# Patient Record
Sex: Female | Born: 1945 | Race: White | Hispanic: No | Marital: Married | Smoking: Former smoker
Health system: Southern US, Community
[De-identification: ages and names within clinical notes are randomized; demographics above are authoritative.]

## PROBLEM LIST (undated history)

## (undated) DIAGNOSIS — J449 Chronic obstructive pulmonary disease, unspecified: Secondary | ICD-10-CM

## (undated) DIAGNOSIS — E079 Disorder of thyroid, unspecified: Secondary | ICD-10-CM

## (undated) DIAGNOSIS — I10 Essential (primary) hypertension: Secondary | ICD-10-CM

## (undated) DIAGNOSIS — F99 Mental disorder, not otherwise specified: Secondary | ICD-10-CM

## (undated) DIAGNOSIS — I639 Cerebral infarction, unspecified: Secondary | ICD-10-CM

## (undated) DIAGNOSIS — D649 Anemia, unspecified: Secondary | ICD-10-CM

## (undated) DIAGNOSIS — F329 Major depressive disorder, single episode, unspecified: Secondary | ICD-10-CM

## (undated) DIAGNOSIS — G2581 Restless legs syndrome: Secondary | ICD-10-CM

## (undated) DIAGNOSIS — E785 Hyperlipidemia, unspecified: Secondary | ICD-10-CM

## (undated) DIAGNOSIS — R112 Nausea with vomiting, unspecified: Secondary | ICD-10-CM

## (undated) DIAGNOSIS — R0981 Nasal congestion: Secondary | ICD-10-CM

## (undated) DIAGNOSIS — K219 Gastro-esophageal reflux disease without esophagitis: Secondary | ICD-10-CM

## (undated) DIAGNOSIS — F32A Depression, unspecified: Secondary | ICD-10-CM

## (undated) DIAGNOSIS — F419 Anxiety disorder, unspecified: Secondary | ICD-10-CM

## (undated) DIAGNOSIS — M0230 Reiter's disease, unspecified site: Secondary | ICD-10-CM

## (undated) DIAGNOSIS — G709 Myoneural disorder, unspecified: Secondary | ICD-10-CM

## (undated) DIAGNOSIS — Z87440 Personal history of urinary (tract) infections: Secondary | ICD-10-CM

## (undated) DIAGNOSIS — K5792 Diverticulitis of intestine, part unspecified, without perforation or abscess without bleeding: Secondary | ICD-10-CM

## (undated) DIAGNOSIS — Z9889 Other specified postprocedural states: Secondary | ICD-10-CM

## (undated) DIAGNOSIS — T4145XA Adverse effect of unspecified anesthetic, initial encounter: Secondary | ICD-10-CM

## (undated) DIAGNOSIS — C801 Malignant (primary) neoplasm, unspecified: Secondary | ICD-10-CM

## (undated) HISTORY — PX: EYE SURGERY: SHX253

## (undated) HISTORY — DX: Reiter's disease, unspecified site: M02.30

## (undated) HISTORY — DX: Myoneural disorder, unspecified: G70.9

## (undated) HISTORY — DX: Diverticulitis of intestine, part unspecified, without perforation or abscess without bleeding: K57.92

## (undated) HISTORY — DX: Hyperlipidemia, unspecified: E78.5

## (undated) HISTORY — DX: Essential (primary) hypertension: I10

## (undated) HISTORY — PX: BREAST RECONSTRUCTION: SHX9

## (undated) HISTORY — DX: Restless legs syndrome: G25.81

## (undated) HISTORY — PX: TRAM: SHX5363

## (undated) HISTORY — DX: Cerebral infarction, unspecified: I63.9

## (undated) HISTORY — DX: Nasal congestion: R09.81

## (undated) HISTORY — DX: Anemia, unspecified: D64.9

## (undated) HISTORY — DX: Personal history of urinary (tract) infections: Z87.440

## (undated) HISTORY — DX: Malignant (primary) neoplasm, unspecified: C80.1

## (undated) HISTORY — PX: BACK SURGERY: SHX140

## (undated) HISTORY — DX: Gastro-esophageal reflux disease without esophagitis: K21.9

## (undated) HISTORY — DX: Disorder of thyroid, unspecified: E07.9

---

## 1974-10-19 HISTORY — PX: ABDOMINAL HYSTERECTOMY: SHX81

## 1975-10-20 DIAGNOSIS — T8859XA Other complications of anesthesia, initial encounter: Secondary | ICD-10-CM

## 1975-10-20 HISTORY — DX: Other complications of anesthesia, initial encounter: T88.59XA

## 2004-09-04 ENCOUNTER — Ambulatory Visit: Payer: Self-pay | Admitting: Internal Medicine

## 2004-10-19 HISTORY — PX: OVARY SURGERY: SHX727

## 2004-10-24 ENCOUNTER — Ambulatory Visit: Payer: Self-pay | Admitting: Gastroenterology

## 2004-11-11 ENCOUNTER — Ambulatory Visit: Payer: Self-pay | Admitting: Gastroenterology

## 2004-11-19 ENCOUNTER — Ambulatory Visit: Payer: Self-pay | Admitting: Obstetrics and Gynecology

## 2005-03-19 ENCOUNTER — Ambulatory Visit: Payer: Self-pay | Admitting: Obstetrics and Gynecology

## 2005-11-20 ENCOUNTER — Ambulatory Visit: Payer: Self-pay | Admitting: Gastroenterology

## 2006-04-20 ENCOUNTER — Ambulatory Visit: Payer: Self-pay | Admitting: Obstetrics and Gynecology

## 2006-09-03 ENCOUNTER — Ambulatory Visit: Payer: Self-pay | Admitting: Internal Medicine

## 2006-09-13 ENCOUNTER — Ambulatory Visit: Payer: Self-pay | Admitting: Internal Medicine

## 2006-09-28 ENCOUNTER — Ambulatory Visit: Payer: Self-pay | Admitting: Surgery

## 2006-10-19 HISTORY — PX: CHOLECYSTECTOMY: SHX55

## 2006-11-24 ENCOUNTER — Ambulatory Visit: Payer: Self-pay | Admitting: Gastroenterology

## 2007-04-27 ENCOUNTER — Ambulatory Visit: Payer: Self-pay | Admitting: Obstetrics and Gynecology

## 2007-10-20 DIAGNOSIS — C801 Malignant (primary) neoplasm, unspecified: Secondary | ICD-10-CM

## 2007-10-20 HISTORY — DX: Malignant (primary) neoplasm, unspecified: C80.1

## 2007-10-20 HISTORY — PX: BREAST SURGERY: SHX581

## 2008-06-06 ENCOUNTER — Ambulatory Visit: Payer: Self-pay | Admitting: Obstetrics and Gynecology

## 2008-06-29 ENCOUNTER — Ambulatory Visit: Payer: Self-pay | Admitting: Surgery

## 2008-07-12 ENCOUNTER — Encounter: Admission: RE | Admit: 2008-07-12 | Discharge: 2008-07-12 | Payer: Self-pay | Admitting: General Surgery

## 2008-07-19 ENCOUNTER — Ambulatory Visit: Payer: Self-pay | Admitting: Gastroenterology

## 2008-07-27 ENCOUNTER — Encounter (INDEPENDENT_AMBULATORY_CARE_PROVIDER_SITE_OTHER): Payer: Self-pay | Admitting: General Surgery

## 2008-07-29 ENCOUNTER — Inpatient Hospital Stay (HOSPITAL_COMMUNITY): Admission: RE | Admit: 2008-07-29 | Discharge: 2008-07-30 | Payer: Self-pay | Admitting: General Surgery

## 2008-08-03 ENCOUNTER — Ambulatory Visit: Payer: Self-pay | Admitting: Oncology

## 2008-08-20 ENCOUNTER — Ambulatory Visit: Payer: Self-pay | Admitting: Oncology

## 2008-08-23 ENCOUNTER — Ambulatory Visit (HOSPITAL_COMMUNITY): Admission: RE | Admit: 2008-08-23 | Discharge: 2008-08-23 | Payer: Self-pay | Admitting: Oncology

## 2008-08-24 ENCOUNTER — Ambulatory Visit (HOSPITAL_COMMUNITY): Admission: RE | Admit: 2008-08-24 | Discharge: 2008-08-24 | Payer: Self-pay | Admitting: General Surgery

## 2008-08-28 LAB — CBC WITH DIFFERENTIAL (CANCER CENTER ONLY)
BASO%: 0.9 % (ref 0.0–2.0)
Eosinophils Absolute: 0.1 10*3/uL (ref 0.0–0.5)
HCT: 35.2 % (ref 34.8–46.6)
LYMPH#: 2.6 10*3/uL (ref 0.9–3.3)
LYMPH%: 21.2 % (ref 14.0–48.0)
MCV: 98 fL (ref 81–101)
MONO#: 0.7 10*3/uL (ref 0.1–0.9)
Platelets: 271 10*3/uL (ref 145–400)
RBC: 3.59 10*6/uL — ABNORMAL LOW (ref 3.70–5.32)
RDW: 11.1 % (ref 10.5–14.6)
WBC: 12.4 10*3/uL — ABNORMAL HIGH (ref 3.9–10.0)

## 2008-08-28 LAB — CMP (CANCER CENTER ONLY)
AST: 20 U/L (ref 11–38)
Alkaline Phosphatase: 56 U/L (ref 26–84)
Glucose, Bld: 181 mg/dL — ABNORMAL HIGH (ref 73–118)
Sodium: 142 mEq/L (ref 128–145)
Total Bilirubin: 0.7 mg/dl (ref 0.20–1.60)
Total Protein: 7.8 g/dL (ref 6.4–8.1)

## 2008-08-31 ENCOUNTER — Ambulatory Visit (HOSPITAL_COMMUNITY): Admission: RE | Admit: 2008-08-31 | Discharge: 2008-08-31 | Payer: Self-pay | Admitting: Oncology

## 2008-09-19 ENCOUNTER — Ambulatory Visit (HOSPITAL_BASED_OUTPATIENT_CLINIC_OR_DEPARTMENT_OTHER): Admission: RE | Admit: 2008-09-19 | Discharge: 2008-09-19 | Payer: Self-pay | Admitting: General Surgery

## 2008-09-20 LAB — CBC WITH DIFFERENTIAL (CANCER CENTER ONLY)
BASO#: 0.3 10*3/uL — ABNORMAL HIGH (ref 0.0–0.2)
EOS%: 1.1 % (ref 0.0–7.0)
HCT: 36.5 % (ref 34.8–46.6)
HGB: 12.2 g/dL (ref 11.6–15.9)
MCH: 32.8 pg (ref 26.0–34.0)
MCHC: 33.5 g/dL (ref 32.0–36.0)
MONO%: 4.3 % (ref 0.0–13.0)
NEUT%: 83 % — ABNORMAL HIGH (ref 39.6–80.0)

## 2008-09-20 LAB — CMP (CANCER CENTER ONLY)
Albumin: 3.4 g/dL (ref 3.3–5.5)
BUN, Bld: 21 mg/dL (ref 7–22)
Calcium: 9.8 mg/dL (ref 8.0–10.3)
Chloride: 103 mEq/L (ref 98–108)
Creat: 1.1 mg/dl (ref 0.6–1.2)
Glucose, Bld: 171 mg/dL — ABNORMAL HIGH (ref 73–118)
Potassium: 4.6 mEq/L (ref 3.3–4.7)

## 2008-09-28 LAB — BASIC METABOLIC PANEL - CANCER CENTER ONLY
CO2: 30 mEq/L (ref 18–33)
Chloride: 105 mEq/L (ref 98–108)
Potassium: 4.1 mEq/L (ref 3.3–4.7)

## 2008-10-01 LAB — MANUAL DIFFERENTIAL (CHCC SATELLITE)
ALC: 4.1 10*3/uL — ABNORMAL HIGH (ref 0.6–2.2)
Band Neutrophils: 6 % (ref 0–10)
LYMPH: 21 % (ref 14–48)
MONO: 12 % (ref 0–13)
Metamyelocytes: 6 % — ABNORMAL HIGH (ref 0–0)
SEG: 55 % (ref 40–75)

## 2008-10-01 LAB — CBC WITH DIFFERENTIAL (CANCER CENTER ONLY)
HCT: 33.8 % — ABNORMAL LOW (ref 34.8–46.6)
MCV: 96 fL (ref 81–101)
RDW: 11.7 % (ref 10.5–14.6)
WBC: 19.5 10*3/uL — ABNORMAL HIGH (ref 3.9–10.0)

## 2008-10-09 ENCOUNTER — Ambulatory Visit: Payer: Self-pay | Admitting: Oncology

## 2008-10-15 LAB — CBC WITH DIFFERENTIAL (CANCER CENTER ONLY)
BASO%: 0.6 % (ref 0.0–2.0)
Eosinophils Absolute: 0.2 10*3/uL (ref 0.0–0.5)
LYMPH%: 9.1 % — ABNORMAL LOW (ref 14.0–48.0)
MONO#: 0.6 10*3/uL (ref 0.1–0.9)
MONO%: 3.2 % (ref 0.0–13.0)
NEUT#: 14.6 10*3/uL — ABNORMAL HIGH (ref 1.5–6.5)
Platelets: 321 10*3/uL (ref 145–400)
RBC: 3.51 10*6/uL — ABNORMAL LOW (ref 3.70–5.32)
RDW: 11.8 % (ref 10.5–14.6)
WBC: 17 10*3/uL — ABNORMAL HIGH (ref 3.9–10.0)

## 2008-10-15 LAB — CMP (CANCER CENTER ONLY)
ALT(SGPT): 16 U/L (ref 10–47)
Albumin: 3.5 g/dL (ref 3.3–5.5)
CO2: 28 mEq/L (ref 18–33)
Chloride: 103 mEq/L (ref 98–108)
Potassium: 4.4 mEq/L (ref 3.3–4.7)
Sodium: 137 mEq/L (ref 128–145)
Total Bilirubin: 0.6 mg/dl (ref 0.20–1.60)
Total Protein: 8 g/dL (ref 6.4–8.1)

## 2008-10-19 DIAGNOSIS — R112 Nausea with vomiting, unspecified: Secondary | ICD-10-CM

## 2008-10-19 DIAGNOSIS — Z9889 Other specified postprocedural states: Secondary | ICD-10-CM

## 2008-10-19 DIAGNOSIS — I639 Cerebral infarction, unspecified: Secondary | ICD-10-CM

## 2008-10-19 HISTORY — DX: Cerebral infarction, unspecified: I63.9

## 2008-10-19 HISTORY — PX: HERNIA REPAIR: SHX51

## 2008-10-19 HISTORY — DX: Nausea with vomiting, unspecified: R11.2

## 2008-10-19 HISTORY — DX: Other specified postprocedural states: Z98.890

## 2008-10-23 LAB — MANUAL DIFFERENTIAL (CHCC SATELLITE)
ALC: 4.5 10*3/uL — ABNORMAL HIGH (ref 0.6–2.2)
ANC (CHCC HP manual diff): 23.9 10*3/uL — ABNORMAL HIGH (ref 1.5–6.7)
Blasts: 1 % — ABNORMAL HIGH (ref 0–0)
LYMPH: 15 % (ref 14–48)
MONO: 5 % (ref 0–13)
PLT EST ~~LOC~~: ADEQUATE
Platelet Morphology: NORMAL
SEG: 61 % (ref 40–75)

## 2008-10-23 LAB — CBC WITH DIFFERENTIAL (CANCER CENTER ONLY)
HCT: 35.6 % (ref 34.8–46.6)
MCH: 32.5 pg (ref 26.0–34.0)
MCV: 97 fL (ref 81–101)
Platelets: 159 10*3/uL (ref 145–400)
RDW: 11.7 % (ref 10.5–14.6)

## 2008-10-23 LAB — BASIC METABOLIC PANEL - CANCER CENTER ONLY
BUN, Bld: 13 mg/dL (ref 7–22)
Chloride: 99 mEq/L (ref 98–108)
Potassium: 3.8 mEq/L (ref 3.3–4.7)
Sodium: 141 mEq/L (ref 128–145)

## 2008-10-23 LAB — TSH: TSH: 37.629 u[IU]/mL — ABNORMAL HIGH (ref 0.350–4.500)

## 2008-10-29 LAB — CBC WITH DIFFERENTIAL (CANCER CENTER ONLY)
Eosinophils Absolute: 0.2 10*3/uL (ref 0.0–0.5)
HCT: 33.7 % — ABNORMAL LOW (ref 34.8–46.6)
LYMPH%: 17.1 % (ref 14.0–48.0)
MCV: 96 fL (ref 81–101)
MONO#: 1.5 10*3/uL — ABNORMAL HIGH (ref 0.1–0.9)
Platelets: 316 10*3/uL (ref 145–400)

## 2008-11-12 ENCOUNTER — Encounter: Admission: RE | Admit: 2008-11-12 | Discharge: 2008-11-12 | Payer: Self-pay | Admitting: Oncology

## 2008-11-12 LAB — CBC WITH DIFFERENTIAL (CANCER CENTER ONLY)
EOS%: 1.1 % (ref 0.0–7.0)
MCH: 32.7 pg (ref 26.0–34.0)
MCHC: 33.6 g/dL (ref 32.0–36.0)
MONO%: 2.7 % (ref 0.0–13.0)
NEUT#: 15.3 10*3/uL — ABNORMAL HIGH (ref 1.5–6.5)
Platelets: 280 10*3/uL (ref 145–400)

## 2008-11-12 LAB — CMP (CANCER CENTER ONLY)
AST: 28 U/L (ref 11–38)
Alkaline Phosphatase: 55 U/L (ref 26–84)
BUN, Bld: 12 mg/dL (ref 7–22)
Glucose, Bld: 162 mg/dL — ABNORMAL HIGH (ref 73–118)
Potassium: 4.1 mEq/L (ref 3.3–4.7)
Sodium: 138 mEq/L (ref 128–145)
Total Bilirubin: 0.7 mg/dl (ref 0.20–1.60)
Total Protein: 8.1 g/dL (ref 6.4–8.1)

## 2008-11-21 LAB — CBC WITH DIFFERENTIAL (CANCER CENTER ONLY)
BASO#: 0.1 10*3/uL (ref 0.0–0.2)
Eosinophils Absolute: 0.2 10*3/uL (ref 0.0–0.5)
HGB: 12.3 g/dL (ref 11.6–15.9)
LYMPH#: 1.4 10*3/uL (ref 0.9–3.3)
MCH: 32.9 pg (ref 26.0–34.0)
MONO#: 0.4 10*3/uL (ref 0.1–0.9)
MONO%: 2.3 % (ref 0.0–13.0)
NEUT#: 14 10*3/uL — ABNORMAL HIGH (ref 1.5–6.5)
RBC: 3.72 10*6/uL (ref 3.70–5.32)
WBC: 16.1 10*3/uL — ABNORMAL HIGH (ref 3.9–10.0)

## 2008-11-21 LAB — CMP (CANCER CENTER ONLY)
AST: 22 U/L (ref 11–38)
Albumin: 3.6 g/dL (ref 3.3–5.5)
Alkaline Phosphatase: 53 U/L (ref 26–84)
BUN, Bld: 12 mg/dL (ref 7–22)
Glucose, Bld: 209 mg/dL — ABNORMAL HIGH (ref 73–118)
Potassium: 4.2 mEq/L (ref 3.3–4.7)
Total Bilirubin: 0.7 mg/dl (ref 0.20–1.60)

## 2008-11-27 ENCOUNTER — Ambulatory Visit: Payer: Self-pay | Admitting: Oncology

## 2008-12-11 LAB — CBC WITH DIFFERENTIAL (CANCER CENTER ONLY)
BASO#: 0.1 10*3/uL (ref 0.0–0.2)
Eosinophils Absolute: 0.2 10*3/uL (ref 0.0–0.5)
HCT: 33.2 % — ABNORMAL LOW (ref 34.8–46.6)
HGB: 11.3 g/dL — ABNORMAL LOW (ref 11.6–15.9)
LYMPH%: 7.4 % — ABNORMAL LOW (ref 14.0–48.0)
MCH: 33.3 pg (ref 26.0–34.0)
MCV: 98 fL (ref 81–101)
MONO#: 0.6 10*3/uL (ref 0.1–0.9)
MONO%: 3.4 % (ref 0.0–13.0)
NEUT%: 87.6 % — ABNORMAL HIGH (ref 39.6–80.0)
RBC: 3.39 10*6/uL — ABNORMAL LOW (ref 3.70–5.32)

## 2008-12-11 LAB — CMP (CANCER CENTER ONLY)
Albumin: 3.5 g/dL (ref 3.3–5.5)
BUN, Bld: 15 mg/dL (ref 7–22)
CO2: 26 mEq/L (ref 18–33)
Calcium: 9.9 mg/dL (ref 8.0–10.3)
Chloride: 104 mEq/L (ref 98–108)
Creat: 0.7 mg/dl (ref 0.6–1.2)
Glucose, Bld: 171 mg/dL — ABNORMAL HIGH (ref 73–118)
Potassium: 4.8 mEq/L — ABNORMAL HIGH (ref 3.3–4.7)

## 2008-12-11 LAB — TECHNOLOGIST REVIEW CHCC SATELLITE

## 2008-12-18 LAB — MANUAL DIFFERENTIAL (CHCC SATELLITE)
ALC: 3.3 10*3/uL — ABNORMAL HIGH (ref 0.6–2.2)
ANC (CHCC HP manual diff): 12.3 10*3/uL — ABNORMAL HIGH (ref 1.5–6.7)
Band Neutrophils: 16 % — ABNORMAL HIGH (ref 0–10)
LYMPH: 18 % (ref 14–48)
PLT EST ~~LOC~~: ADEQUATE

## 2008-12-18 LAB — BASIC METABOLIC PANEL - CANCER CENTER ONLY
BUN, Bld: 12 mg/dL (ref 7–22)
Calcium: 9.6 mg/dL (ref 8.0–10.3)
Potassium: 4.5 mEq/L (ref 3.3–4.7)
Sodium: 139 mEq/L (ref 128–145)

## 2008-12-18 LAB — CBC WITH DIFFERENTIAL (CANCER CENTER ONLY)
HCT: 32.7 % — ABNORMAL LOW (ref 34.8–46.6)
RDW: 12.7 % (ref 10.5–14.6)
WBC: 18.3 10*3/uL — ABNORMAL HIGH (ref 3.9–10.0)

## 2008-12-23 ENCOUNTER — Ambulatory Visit (HOSPITAL_COMMUNITY): Admission: RE | Admit: 2008-12-23 | Discharge: 2008-12-23 | Payer: Self-pay | Admitting: Oncology

## 2009-01-28 ENCOUNTER — Ambulatory Visit: Payer: Self-pay | Admitting: Oncology

## 2009-01-29 LAB — CMP (CANCER CENTER ONLY)
Albumin: 4.1 g/dL (ref 3.3–5.5)
BUN, Bld: 15 mg/dL (ref 7–22)
CO2: 28 mEq/L (ref 18–33)
Calcium: 9.7 mg/dL (ref 8.0–10.3)
Chloride: 100 mEq/L (ref 98–108)
Creat: 0.9 mg/dl (ref 0.6–1.2)

## 2009-01-29 LAB — CBC WITH DIFFERENTIAL (CANCER CENTER ONLY)
BASO%: 0.9 % (ref 0.0–2.0)
LYMPH#: 2 10*3/uL (ref 0.9–3.3)
LYMPH%: 33.5 % (ref 14.0–48.0)
MONO#: 0.4 10*3/uL (ref 0.1–0.9)
Platelets: 289 10*3/uL (ref 145–400)
RDW: 11 % (ref 10.5–14.6)
WBC: 6 10*3/uL (ref 3.9–10.0)

## 2009-01-30 LAB — CANCER ANTIGEN 27.29: CA 27.29: 37 U/mL (ref 0–39)

## 2009-02-08 ENCOUNTER — Encounter: Admission: RE | Admit: 2009-02-08 | Discharge: 2009-02-08 | Payer: Self-pay | Admitting: Oncology

## 2009-02-11 IMAGING — CT CT PELVIS W/ CM
2 of 5 series · 16 of 46 positions shown, 18 images · IV contrast (agent unspecified)
Comparison: Chest radiograph of 07/29/2008

CT CHEST

Addendum Begins

Review of today's exam reveals a 4 mm focus of sclerosis in the
upper portion of the manubrium of the sternum.  Although
nonspecific, this could possibly represent a tiny solitary
metastatic focus.  Attention to this region on follow up studies is
recommended.
Addendum Ends
CLINICAL DATA: Right breast cancer.  Right mastectomy.  Completed
radiation therapy. Subsequent treatment strategy.
CT CHEST, ABDOMEN AND PELVIS WITH CONTRAST
TECHNIQUE: Multidetector CT imaging of the chest, abdomen and
pelvis was performed following the standard protocol during bolus
administration of intravenous contrast.
Contrast: 100 ml Smnipaque-EWW

[Series 2: cap 5.0 b40f · axial · 0.70mm/px · z∈[-596,-81]mm · 13 of 119 slices shown, 15 images]
[im 8/119  soft-tissue]
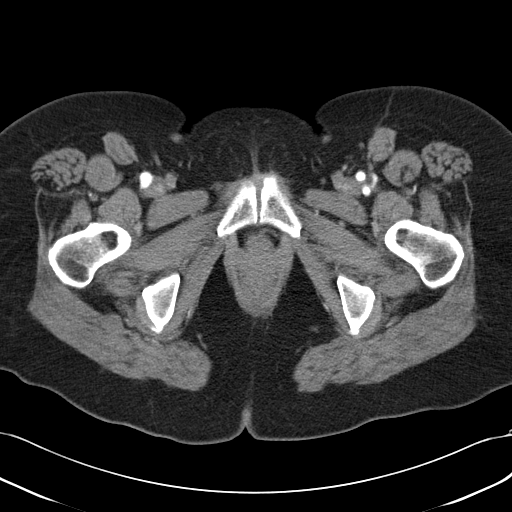
[im 8/119  bone]
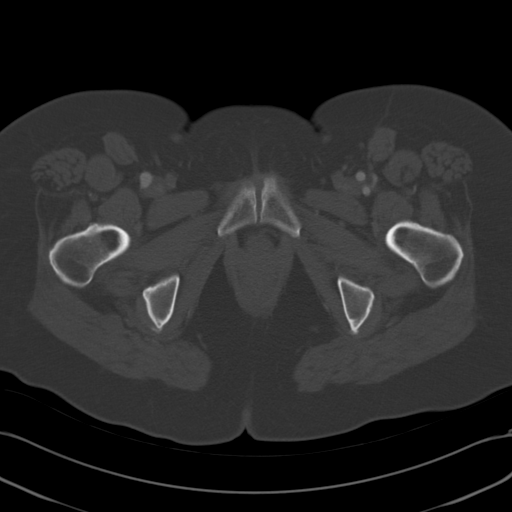
[im 15/119  soft-tissue]
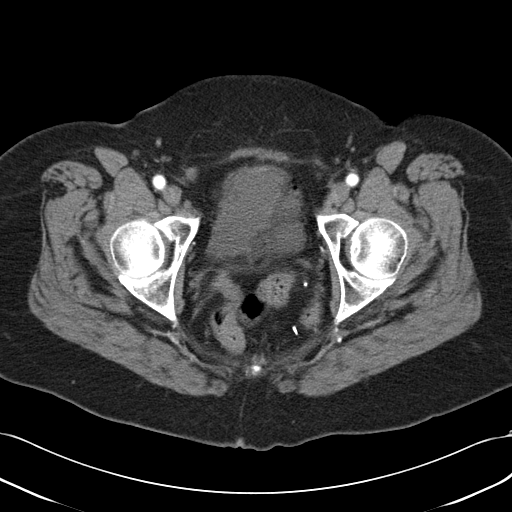
[im 23/119  soft-tissue]
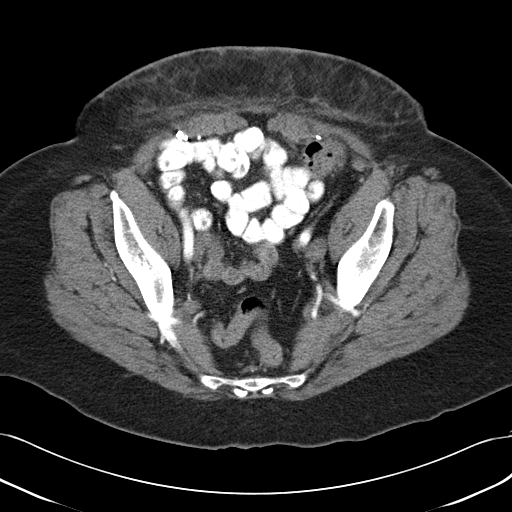
[im 37/119  soft-tissue]
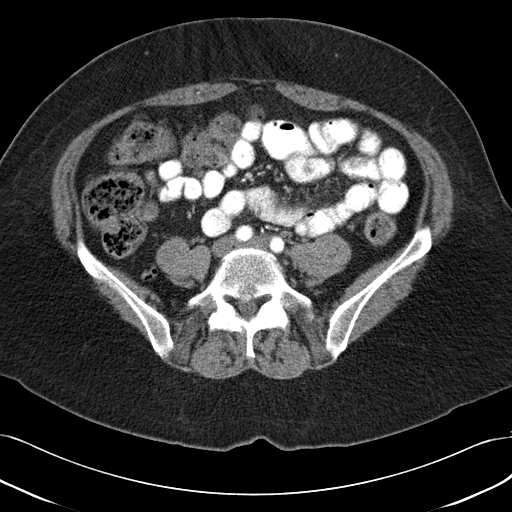
[im 45/119  soft-tissue]
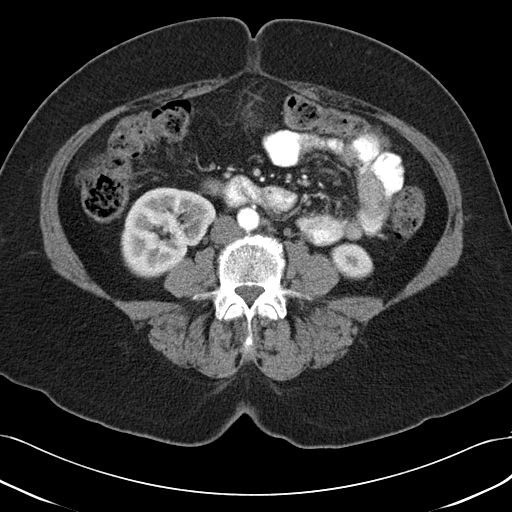
[im 52/119  soft-tissue]
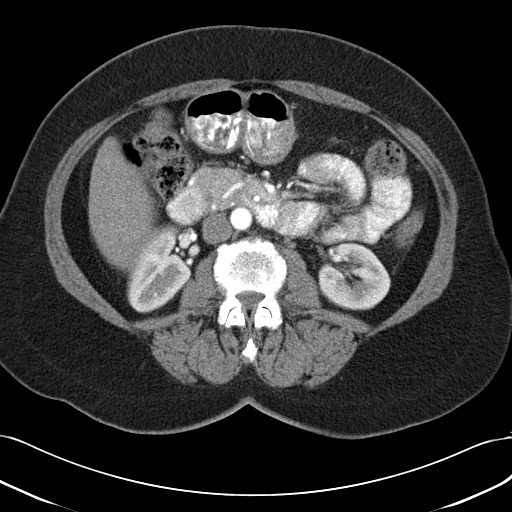
[im 60/119  soft-tissue]
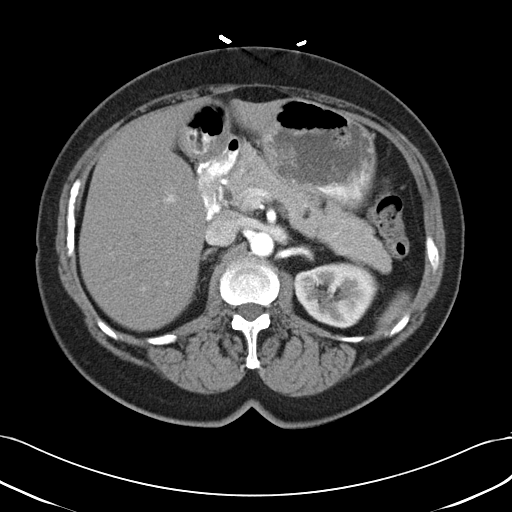
[im 67/119  soft-tissue]
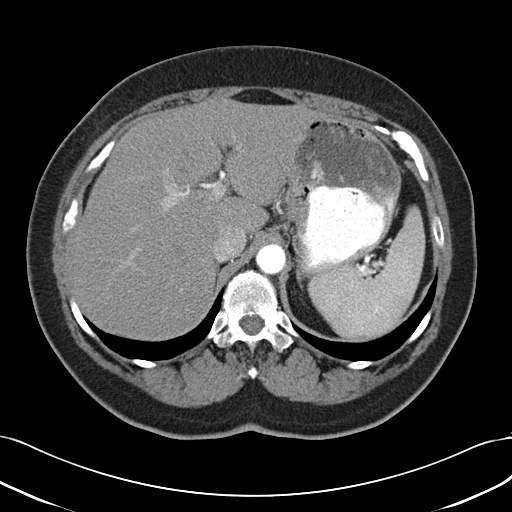
[im 74/119  soft-tissue]
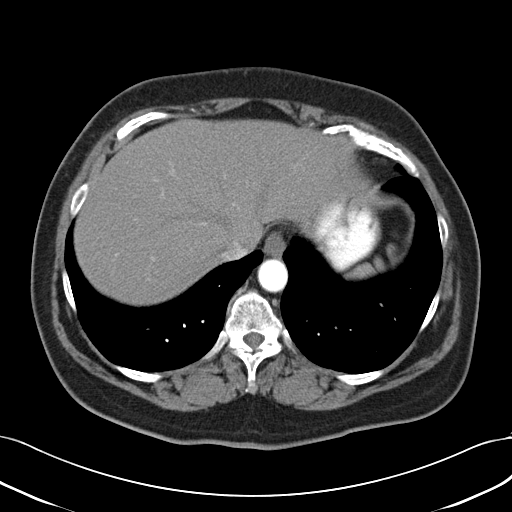
[im 74/119  bone]
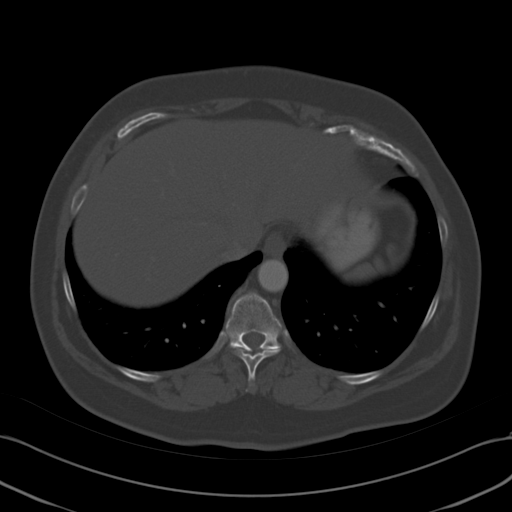
[im 82/119  soft-tissue]
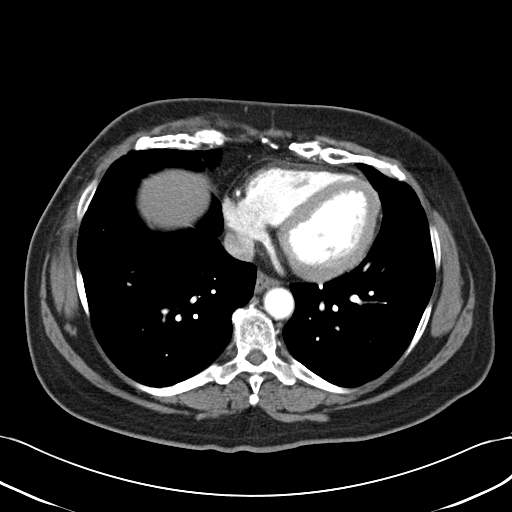
[im 96/119  soft-tissue]
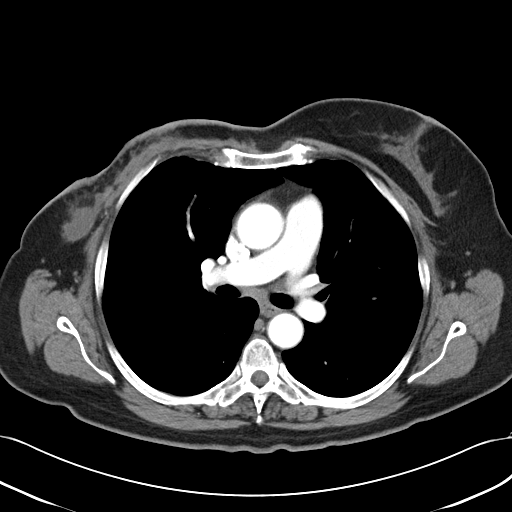
[im 104/119  soft-tissue]
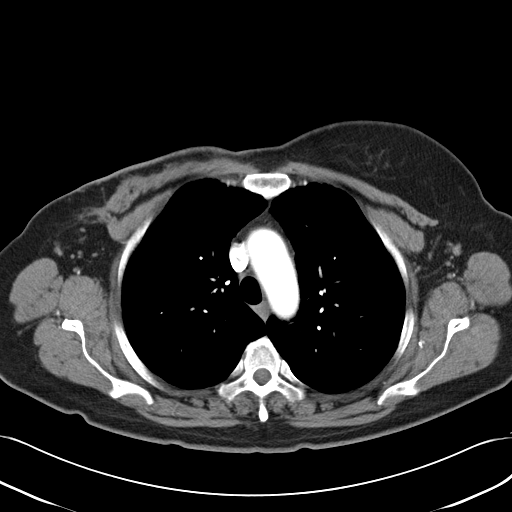
[im 111/119  soft-tissue]
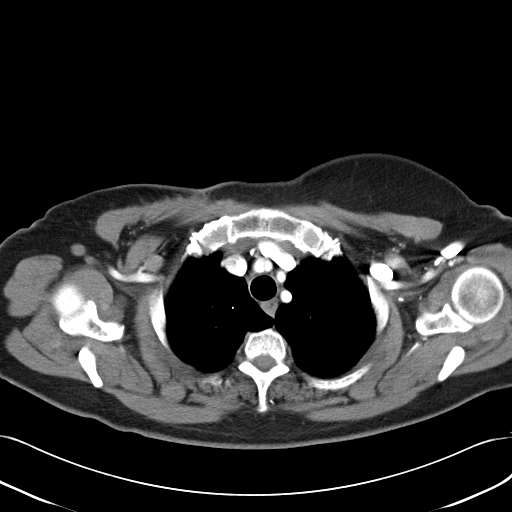

[Series 602: <mpr thick range> · coronal · 1.16mm/px · 3 of 91 slices shown]
[im 31/91  soft-tissue]
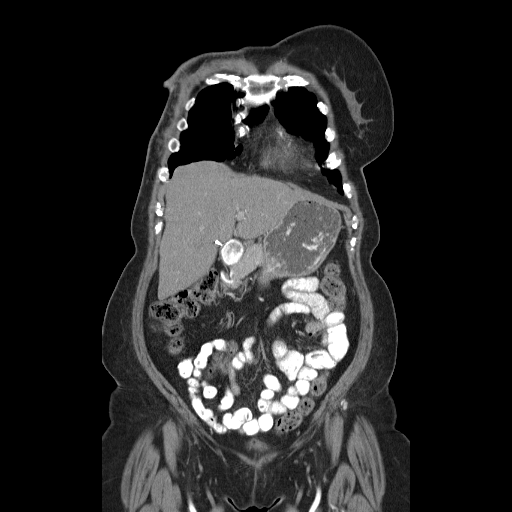
[im 41/91  soft-tissue]
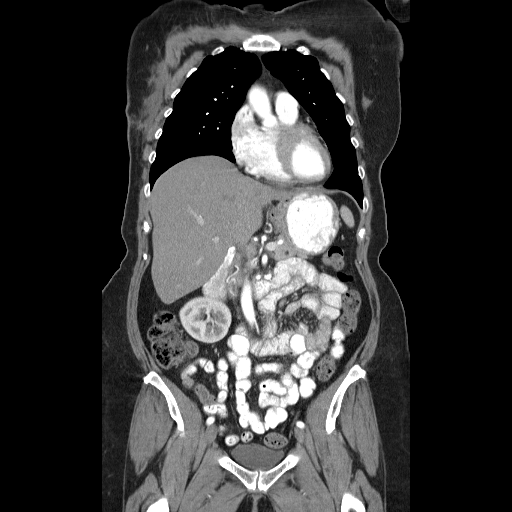
[im 51/91  soft-tissue]
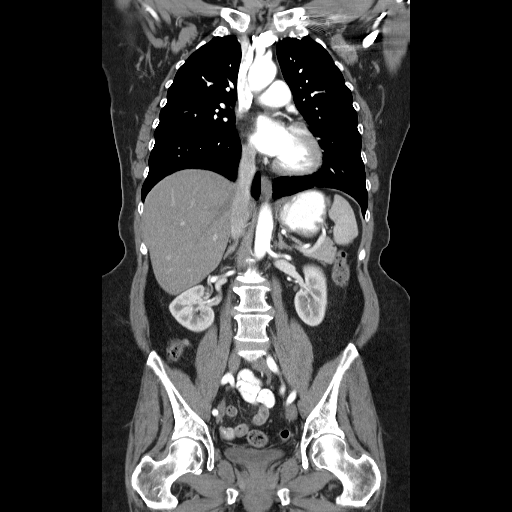

[16 of 46 positions shown; findings below may reference images not displayed]

FINDINGS: No pathologic hilar or mediastinal adenopathy is
identified.

Right mastectomy noted.  A 2.4 x 1.8 cm fluid signal density
projecting over the right lateral breast probably represents a
postoperative seroma.

Prior right axillary dissection noted. A 5 x 4 x 3 mm subpleural
nodule in the right middle lobe as shown on image 26 of series 4.
IMPRESSION: 1.  Density along the right lateral breast is nonspecific but
likely represents a postoperative seroma.
2.  4 mm subpleural lesion of the right middle lobe, statistically
likely to be benign but warranting careful observation.

CT ABDOMEN
FINDINGS: A well-defined 8 x 4 x 6 mm lesion of the pancreatic
tail is noted, with internal density measurements of negative 86
HU.  This lesion is most compatible with a small pancreatic lipoma.

The liver, spleen, and adrenal glands appear unremarkable.  The
gallbladder is surgically absent.

No pathologic retroperitoneal or porta hepatis adenopathy is
identified.

A 12 by 10 mm hypodense lesion along the left renal hilum is
compatible with a small parapelvic cyst.  No dilated upper
abdominal bowel identified.
IMPRESSION: 1.  Small pancreatic tail lipoma.

2.  No specific findings of upper abdominal malignancy.

CT PELVIS
FINDINGS: No pathologic pelvic adenopathy is identified.

The appendix appears unremarkable, as does the urinary bladder.  No
specific findings of pelvic metastatic disease are identified.
There is evidence of a prior lower transverse incision.
IMPRESSION: 1.  No findings of pelvic malignancy.

## 2009-02-21 ENCOUNTER — Inpatient Hospital Stay (HOSPITAL_COMMUNITY): Admission: RE | Admit: 2009-02-21 | Discharge: 2009-02-24 | Payer: Self-pay | Admitting: Plastic Surgery

## 2009-02-21 ENCOUNTER — Encounter (INDEPENDENT_AMBULATORY_CARE_PROVIDER_SITE_OTHER): Payer: Self-pay | Admitting: Plastic Surgery

## 2009-03-12 ENCOUNTER — Ambulatory Visit: Payer: Self-pay | Admitting: Oncology

## 2009-03-14 LAB — CMP (CANCER CENTER ONLY)
Albumin: 3.4 g/dL (ref 3.3–5.5)
CO2: 28 mEq/L (ref 18–33)
Calcium: 9.9 mg/dL (ref 8.0–10.3)
Chloride: 103 mEq/L (ref 98–108)
Glucose, Bld: 103 mg/dL (ref 73–118)
Potassium: 4.1 mEq/L (ref 3.3–4.7)
Sodium: 146 mEq/L — ABNORMAL HIGH (ref 128–145)
Total Protein: 8 g/dL (ref 6.4–8.1)

## 2009-03-14 LAB — LIPID PANEL
Cholesterol: 234 mg/dL — ABNORMAL HIGH (ref 0–200)
Triglycerides: 158 mg/dL — ABNORMAL HIGH (ref <150)

## 2009-03-14 LAB — CBC WITH DIFFERENTIAL (CANCER CENTER ONLY)
BASO%: 0.7 % (ref 0.0–2.0)
LYMPH%: 34.7 % (ref 14.0–48.0)
MCH: 32.7 pg (ref 26.0–34.0)
MCV: 96 fL (ref 81–101)
MONO%: 6.3 % (ref 0.0–13.0)
Platelets: 342 10*3/uL (ref 145–400)
RDW: 11.1 % (ref 10.5–14.6)

## 2009-04-17 ENCOUNTER — Ambulatory Visit: Payer: Self-pay | Admitting: Oncology

## 2009-05-08 ENCOUNTER — Ambulatory Visit (HOSPITAL_BASED_OUTPATIENT_CLINIC_OR_DEPARTMENT_OTHER): Admission: RE | Admit: 2009-05-08 | Discharge: 2009-05-08 | Payer: Self-pay | Admitting: General Surgery

## 2009-05-31 ENCOUNTER — Ambulatory Visit: Payer: Self-pay | Admitting: Oncology

## 2009-06-17 ENCOUNTER — Ambulatory Visit: Payer: Self-pay | Admitting: Family Medicine

## 2009-06-17 DIAGNOSIS — R55 Syncope and collapse: Secondary | ICD-10-CM

## 2009-06-17 DIAGNOSIS — E039 Hypothyroidism, unspecified: Secondary | ICD-10-CM | POA: Insufficient documentation

## 2009-06-17 DIAGNOSIS — R209 Unspecified disturbances of skin sensation: Secondary | ICD-10-CM

## 2009-06-17 DIAGNOSIS — D649 Anemia, unspecified: Secondary | ICD-10-CM

## 2009-06-17 DIAGNOSIS — Z87448 Personal history of other diseases of urinary system: Secondary | ICD-10-CM | POA: Insufficient documentation

## 2009-06-17 DIAGNOSIS — E0590 Thyrotoxicosis, unspecified without thyrotoxic crisis or storm: Secondary | ICD-10-CM | POA: Insufficient documentation

## 2009-06-17 DIAGNOSIS — Z853 Personal history of malignant neoplasm of breast: Secondary | ICD-10-CM | POA: Insufficient documentation

## 2009-06-17 DIAGNOSIS — K921 Melena: Secondary | ICD-10-CM | POA: Insufficient documentation

## 2009-06-17 DIAGNOSIS — Z8719 Personal history of other diseases of the digestive system: Secondary | ICD-10-CM

## 2009-06-17 LAB — CONVERTED CEMR LAB: Vit D, 25-Hydroxy: 40 ng/mL (ref 30–89)

## 2009-06-19 LAB — CONVERTED CEMR LAB: Vitamin B-12: 569 pg/mL (ref 211–911)

## 2009-07-29 ENCOUNTER — Ambulatory Visit: Payer: Self-pay | Admitting: Oncology

## 2009-07-30 LAB — CBC WITH DIFFERENTIAL (CANCER CENTER ONLY)
BASO#: 0.1 10*3/uL (ref 0.0–0.2)
Eosinophils Absolute: 0.2 10*3/uL (ref 0.0–0.5)
HGB: 13.1 g/dL (ref 11.6–15.9)
LYMPH%: 37.8 % (ref 14.0–48.0)
MCH: 32.8 pg (ref 26.0–34.0)
MCV: 98 fL (ref 81–101)
MONO%: 5.5 % (ref 0.0–13.0)
Platelets: 290 10*3/uL (ref 145–400)
RBC: 4 10*6/uL (ref 3.70–5.32)

## 2009-07-30 LAB — CMP (CANCER CENTER ONLY)
Alkaline Phosphatase: 68 U/L (ref 26–84)
BUN, Bld: 11 mg/dL (ref 7–22)
Glucose, Bld: 125 mg/dL — ABNORMAL HIGH (ref 73–118)
Sodium: 143 mEq/L (ref 128–145)
Total Bilirubin: 0.7 mg/dl (ref 0.20–1.60)
Total Protein: 7.6 g/dL (ref 6.4–8.1)

## 2009-09-11 ENCOUNTER — Telehealth: Payer: Self-pay | Admitting: Family Medicine

## 2009-09-11 ENCOUNTER — Ambulatory Visit: Payer: Self-pay | Admitting: Family Medicine

## 2009-09-11 DIAGNOSIS — G459 Transient cerebral ischemic attack, unspecified: Secondary | ICD-10-CM | POA: Insufficient documentation

## 2009-09-11 DIAGNOSIS — R42 Dizziness and giddiness: Secondary | ICD-10-CM

## 2009-09-11 DIAGNOSIS — L919 Hypertrophic disorder of the skin, unspecified: Secondary | ICD-10-CM

## 2009-09-11 DIAGNOSIS — M5412 Radiculopathy, cervical region: Secondary | ICD-10-CM | POA: Insufficient documentation

## 2009-09-11 DIAGNOSIS — L909 Atrophic disorder of skin, unspecified: Secondary | ICD-10-CM | POA: Insufficient documentation

## 2009-09-16 LAB — CONVERTED CEMR LAB
Basophils Absolute: 0 10*3/uL (ref 0.0–0.1)
Calcium: 9.7 mg/dL (ref 8.4–10.5)
Chloride: 103 meq/L (ref 96–112)
Creatinine, Ser: 0.71 mg/dL (ref 0.40–1.20)
Direct LDL: 104 mg/dL — ABNORMAL HIGH
Eosinophils Absolute: 0.1 10*3/uL (ref 0.0–0.7)
Lymphs Abs: 2.9 10*3/uL (ref 0.7–4.0)
MCV: 99.7 fL (ref 78.0–100.0)
Neutrophils Relative %: 55 % (ref 43–77)
Platelets: 282 10*3/uL (ref 150–400)
RDW: 12.9 % (ref 11.5–15.5)
TSH: 2.582 microintl units/mL (ref 0.350–4.500)
WBC: 7.8 10*3/uL (ref 4.0–10.5)

## 2009-09-17 ENCOUNTER — Encounter: Admission: RE | Admit: 2009-09-17 | Discharge: 2009-09-17 | Payer: Self-pay | Admitting: Family Medicine

## 2009-09-19 ENCOUNTER — Telehealth: Payer: Self-pay | Admitting: Family Medicine

## 2009-09-23 ENCOUNTER — Encounter: Payer: Self-pay | Admitting: Family Medicine

## 2009-09-24 ENCOUNTER — Encounter: Payer: Self-pay | Admitting: Family Medicine

## 2009-09-24 ENCOUNTER — Ambulatory Visit: Payer: Self-pay

## 2009-09-25 ENCOUNTER — Encounter (INDEPENDENT_AMBULATORY_CARE_PROVIDER_SITE_OTHER): Payer: Self-pay | Admitting: *Deleted

## 2009-10-01 ENCOUNTER — Encounter: Payer: Self-pay | Admitting: Family Medicine

## 2009-10-04 ENCOUNTER — Encounter: Payer: Self-pay | Admitting: Family Medicine

## 2009-10-08 ENCOUNTER — Encounter: Payer: Self-pay | Admitting: Family Medicine

## 2009-10-19 ENCOUNTER — Encounter: Payer: Self-pay | Admitting: Family Medicine

## 2009-10-28 ENCOUNTER — Ambulatory Visit: Payer: Self-pay | Admitting: Family Medicine

## 2009-10-30 DIAGNOSIS — E785 Hyperlipidemia, unspecified: Secondary | ICD-10-CM

## 2009-11-01 ENCOUNTER — Encounter: Payer: Self-pay | Admitting: Family Medicine

## 2009-11-13 ENCOUNTER — Encounter: Admission: RE | Admit: 2009-11-13 | Discharge: 2009-11-13 | Payer: Self-pay | Admitting: General Surgery

## 2009-11-19 ENCOUNTER — Encounter: Payer: Self-pay | Admitting: Family Medicine

## 2009-11-20 ENCOUNTER — Telehealth: Payer: Self-pay | Admitting: Family Medicine

## 2009-11-21 ENCOUNTER — Encounter: Payer: Self-pay | Admitting: Family Medicine

## 2009-11-28 ENCOUNTER — Encounter: Payer: Self-pay | Admitting: Family Medicine

## 2009-12-06 ENCOUNTER — Encounter: Payer: Self-pay | Admitting: Family Medicine

## 2009-12-11 ENCOUNTER — Inpatient Hospital Stay (HOSPITAL_COMMUNITY): Admission: RE | Admit: 2009-12-11 | Discharge: 2009-12-13 | Payer: Self-pay | Admitting: General Surgery

## 2010-01-07 ENCOUNTER — Encounter: Payer: Self-pay | Admitting: Family Medicine

## 2010-01-24 ENCOUNTER — Ambulatory Visit: Payer: Self-pay | Admitting: Oncology

## 2010-01-27 ENCOUNTER — Encounter (INDEPENDENT_AMBULATORY_CARE_PROVIDER_SITE_OTHER): Payer: Self-pay | Admitting: *Deleted

## 2010-01-28 ENCOUNTER — Encounter: Payer: Self-pay | Admitting: Family Medicine

## 2010-01-28 LAB — CMP (CANCER CENTER ONLY)
ALT(SGPT): 17 U/L (ref 10–47)
BUN, Bld: 16 mg/dL (ref 7–22)
CO2: 29 mEq/L (ref 18–33)
Calcium: 9.7 mg/dL (ref 8.0–10.3)
Chloride: 100 mEq/L (ref 98–108)
Creat: 0.6 mg/dl (ref 0.6–1.2)
Glucose, Bld: 82 mg/dL (ref 73–118)
Total Bilirubin: 0.8 mg/dl (ref 0.20–1.60)

## 2010-01-28 LAB — CBC WITH DIFFERENTIAL (CANCER CENTER ONLY)
BASO#: 0.1 10*3/uL (ref 0.0–0.2)
Eosinophils Absolute: 0.2 10*3/uL (ref 0.0–0.5)
HCT: 38.5 % (ref 34.8–46.6)
HGB: 13.2 g/dL (ref 11.6–15.9)
LYMPH#: 2.6 10*3/uL (ref 0.9–3.3)
LYMPH%: 36 % (ref 14.0–48.0)
MCV: 98 fL (ref 81–101)
MONO#: 0.5 10*3/uL (ref 0.1–0.9)
NEUT%: 53.6 % (ref 39.6–80.0)
WBC: 7.1 10*3/uL (ref 3.9–10.0)

## 2010-01-28 LAB — CANCER ANTIGEN 27.29: CA 27.29: 25 U/mL (ref 0–39)

## 2010-01-30 ENCOUNTER — Ambulatory Visit: Payer: Self-pay | Admitting: Family Medicine

## 2010-01-31 LAB — CONVERTED CEMR LAB
ALT: 13 units/L (ref 0–35)
AST: 19 units/L (ref 0–37)
Alkaline Phosphatase: 75 units/L (ref 39–117)
BUN: 15 mg/dL (ref 6–23)
Bilirubin, Direct: 0 mg/dL (ref 0.0–0.3)
CO2: 29 meq/L (ref 19–32)
Chloride: 104 meq/L (ref 96–112)
Cholesterol: 176 mg/dL (ref 0–200)
Creatinine, Ser: 0.7 mg/dL (ref 0.4–1.2)
Potassium: 4.3 meq/L (ref 3.5–5.1)
Total Bilirubin: 0.7 mg/dL (ref 0.3–1.2)
Total Protein: 8 g/dL (ref 6.0–8.3)
VLDL: 26 mg/dL (ref 0.0–40.0)

## 2010-02-10 ENCOUNTER — Ambulatory Visit: Payer: Self-pay | Admitting: Family Medicine

## 2010-03-11 ENCOUNTER — Ambulatory Visit: Payer: Self-pay | Admitting: Family Medicine

## 2010-03-18 LAB — CONVERTED CEMR LAB: T4, Total: 14.5 ug/dL — ABNORMAL HIGH (ref 5.0–12.5)

## 2010-03-19 ENCOUNTER — Telehealth: Payer: Self-pay | Admitting: Family Medicine

## 2010-05-09 ENCOUNTER — Ambulatory Visit: Payer: Self-pay | Admitting: Oncology

## 2010-05-13 ENCOUNTER — Encounter: Payer: Self-pay | Admitting: Family Medicine

## 2010-05-13 LAB — CBC WITH DIFFERENTIAL (CANCER CENTER ONLY)
BASO%: 0.9 % (ref 0.0–2.0)
Eosinophils Absolute: 0.2 10*3/uL (ref 0.0–0.5)
LYMPH%: 35.2 % (ref 14.0–48.0)
MCH: 33.1 pg (ref 26.0–34.0)
MCV: 97 fL (ref 81–101)
MONO#: 0.5 10*3/uL (ref 0.1–0.9)
MONO%: 6.7 % (ref 0.0–13.0)
NEUT#: 4.1 10*3/uL (ref 1.5–6.5)
Platelets: 326 10*3/uL (ref 145–400)
RBC: 4.16 10*6/uL (ref 3.70–5.32)
RDW: 11.6 % (ref 10.5–14.6)
WBC: 7.5 10*3/uL (ref 3.9–10.0)

## 2010-05-13 LAB — BASIC METABOLIC PANEL - CANCER CENTER ONLY
Calcium: 9.9 mg/dL (ref 8.0–10.3)
Glucose, Bld: 104 mg/dL (ref 73–118)
Potassium: 4.1 mEq/L (ref 3.3–4.7)
Sodium: 140 mEq/L (ref 128–145)

## 2010-05-23 ENCOUNTER — Encounter: Payer: Self-pay | Admitting: Family Medicine

## 2010-05-26 ENCOUNTER — Encounter: Admission: RE | Admit: 2010-05-26 | Discharge: 2010-05-26 | Payer: Self-pay | Admitting: Oncology

## 2010-06-16 ENCOUNTER — Ambulatory Visit (HOSPITAL_COMMUNITY): Admission: RE | Admit: 2010-06-16 | Discharge: 2010-06-16 | Payer: Self-pay | Admitting: Oncology

## 2010-06-16 ENCOUNTER — Encounter: Admission: RE | Admit: 2010-06-16 | Discharge: 2010-06-16 | Payer: Self-pay | Admitting: Oncology

## 2010-06-25 ENCOUNTER — Telehealth: Payer: Self-pay | Admitting: Family Medicine

## 2010-07-21 ENCOUNTER — Ambulatory Visit: Payer: Self-pay | Admitting: Family Medicine

## 2010-07-21 DIAGNOSIS — M1990 Unspecified osteoarthritis, unspecified site: Secondary | ICD-10-CM | POA: Insufficient documentation

## 2010-07-28 LAB — CONVERTED CEMR LAB
Free T4: 0.63 ng/dL (ref 0.60–1.60)
TSH: 15.27 microintl units/mL — ABNORMAL HIGH (ref 0.35–5.50)

## 2010-07-29 ENCOUNTER — Encounter: Admission: RE | Admit: 2010-07-29 | Discharge: 2010-07-29 | Payer: Self-pay | Admitting: General Surgery

## 2010-08-20 ENCOUNTER — Encounter: Admission: RE | Admit: 2010-08-20 | Discharge: 2010-08-20 | Payer: Self-pay | Admitting: General Surgery

## 2010-10-29 ENCOUNTER — Ambulatory Visit
Admission: RE | Admit: 2010-10-29 | Discharge: 2010-10-29 | Payer: Self-pay | Source: Home / Self Care | Attending: Family Medicine | Admitting: Family Medicine

## 2010-10-29 DIAGNOSIS — M7650 Patellar tendinitis, unspecified knee: Secondary | ICD-10-CM | POA: Insufficient documentation

## 2010-10-29 DIAGNOSIS — G9050 Complex regional pain syndrome I, unspecified: Secondary | ICD-10-CM | POA: Insufficient documentation

## 2010-11-11 ENCOUNTER — Ambulatory Visit: Payer: Self-pay | Admitting: Oncology

## 2010-11-13 ENCOUNTER — Encounter: Payer: Self-pay | Admitting: Family Medicine

## 2010-11-13 LAB — COMPREHENSIVE METABOLIC PANEL
ALT: 13 U/L (ref 0–35)
Albumin: 4.2 g/dL (ref 3.5–5.2)
BUN: 17 mg/dL (ref 6–23)
CO2: 27 mEq/L (ref 19–32)
Calcium: 9.6 mg/dL (ref 8.4–10.5)
Chloride: 103 mEq/L (ref 96–112)
Creatinine, Ser: 0.88 mg/dL (ref 0.40–1.20)

## 2010-11-13 LAB — CBC WITH DIFFERENTIAL/PLATELET
Basophils Absolute: 0 10*3/uL (ref 0.0–0.1)
HCT: 38.3 % (ref 34.8–46.6)
HGB: 13.2 g/dL (ref 11.6–15.9)
MONO#: 0.6 10*3/uL (ref 0.1–0.9)
NEUT#: 4.3 10*3/uL (ref 1.5–6.5)
NEUT%: 56.7 % (ref 38.4–76.8)
WBC: 7.7 10*3/uL (ref 3.9–10.3)
lymph#: 2.5 10*3/uL (ref 0.9–3.3)

## 2010-11-18 NOTE — Letter (Signed)
Summary: Generic Letter  [place] [facility]  [address] [place], [place] [address]   Phone: [phone]  Fax: [phone]    11/21/2009  DEISSY GUILBERT [address] [place], [place]  [address]  Dear Dr. Dwain Sarna,   Ms. Spielberg is stable medically, and you should be able to proceed with a ventral hernia repair. Appropriate blood pressure should be maintained, but aside from this, no other additional measures should be needed aside from standard surgical care.       Sincerely,   [name] MD

## 2010-11-18 NOTE — Assessment & Plan Note (Signed)
Summary: follow up/nt   Vital Signs:  Patient profile:   65 year old female Height:      62.75 inches Weight:      174.0 pounds BMI:     31.18 Temp:     98.5 degrees F oral Pulse rate:   76 / minute Pulse rhythm:   regular BP sitting:   120 / 82  (left arm) Cuff size:   regular  Vitals Entered By: Benny Lennert CMA Duncan Dull) (July 21, 2010 3:41 PM)  History of Present Illness: Chief complaint follow up   Thyroid, recent slight decrease of synthroid, feeling better, no scalp, hair or skin changes.  OA celbrex was not working, changed to [employer].  doing better with that.  some myalgias  reiter's in the past.   REVIEW OF SYSTEMS GEN: No acute illnesses, no fever, chills, sweats. - myalgias CV: No chest pain or SOB GI: No noted N or V Otherwise, pertinent positives and negatives are noted in the HPI.   GEN: WDWN, NAD, Non-toxic, A & O x 3 HEENT: Atraumatic, Normocephalic. Neck supple. No masses, No LAD. Ears and Nose: No external deformity. CV: RRR, No M/G/R. No JVD. No thrill. No extra heart sounds. PULM: CTA B, no wheezes, crackles, rhonchi. No retractions. No resp. distress. No accessory muscle use. EXTR: No c/c/e NEURO: Normal gait.  PSYCH: Normally interactive. Conversant. Not depressed or anxious appearing.  Calm demeanor.    Allergies: No Known Drug Allergies   Impression & Recommendations:  Problem # 1:  HYPOTHYROIDISM (ICD-244.9)  Her updated medication list for this problem includes:    Levothyroxine Sodium 125 Mcg Tabs (Levothyroxine sodium) .Marland Kitchen... 1 by mouth daily    Levothyroxine Sodium 112 Mcg Tabs (Levothyroxine sodium) .Marland Kitchen... 1 by mouth every other day  Orders: Venipuncture ([address]) TLB-TSH (Thyroid Stimulating Hormone) (84443-TSH) TLB-T4 (Thyrox), Free [phone]) TLB-T3, Free (Triiodothyronine) (84481-T3FREE)  Problem # 2:  DEGENERATIVE JOINT DISEASE (ICD-715.90)  The following medications were removed from the medication list:    Celebrex  200 Mg Caps (Celecoxib) .Marland Kitchen... Take 1 capsules by mouth two times a day Her updated medication list for this problem includes:    Mobic 15 Mg Tabs (Meloxicam) .Marland Kitchen... Take one tablet by mouth daily  Discussed use of medications, application of heat or cold, and exercises.   Complete Medication List: 1)  Arimidex 1 Mg Tabs (Anastrozole) .... Take 1 tablet by mouth once a day 2)  Zoloft 100 Mg Tabs (Sertraline hcl) .... Take 1 tablet by mouth once a day 3)  Levothyroxine Sodium 125 Mcg Tabs (Levothyroxine sodium) .Marland Kitchen.. 1 by mouth daily 4)  Cyclobenzaprine Hcl 10 Mg Tabs (Cyclobenzaprine hcl) .... Take 1/2 to 1 tablet by mouth as needed for muscle spasms 5)  Levothyroxine Sodium 112 Mcg Tabs (Levothyroxine sodium) .Marland Kitchen.. 1 by mouth every other day 6)  Mobic 15 Mg Tabs (Meloxicam) .... Take one tablet by mouth daily  Patient Instructions: 1)  Tylenol: 2 tablets up to 3-4 times a day 2)  Regular NSAIDS are helpful (avoid in kidney disease and ulcers) 3)  Topical Capzaicin Cream, as needed (wear glove to put on) 4)  Topical Voltaren (NSAID) Gel can help  5)  Glucosamine and Chondroitin often helpful 6)  Omega-3 fish oils may help 7)  Ice joints on bad days, 20 min, 2-3 x / day 8)  REGULAR EXERCISE: swimming, Yoga, Tai Chi, bicycle (NON-IMPACT activity

## 2010-11-18 NOTE — Letter (Signed)
[facility]   Im[facility] By: Lanelle Bal 05/21/2010 09:10:26  _____________________________________________________________________  External Attachment:    Type:   Image     Comment:   External Document

## 2010-11-18 NOTE — Letter (Signed)
[facility]   Imported By: Lanelle Bal 02/19/2010 12:30:13  _____________________________________________________________________  External Attachment:    Type:   Image     Comment:   External Document

## 2010-11-18 NOTE — Letter (Signed)
Summary: [facility]  [facility]   Imported By: Lanelle Bal 01/10/2010 11:52:10  _____________________________________________________________________  External Attachment:    Type:   Image     Comment:   External Document

## 2010-11-18 NOTE — Assessment & Plan Note (Signed)
Summary: 3 month follow up/rbh   Vital Signs:  Patient profile:   66 year old female Height:      62.75 inches Weight:      168.8 pounds BMI:     30.25 Temp:     97.9 degrees F oral Pulse rate:   76 / minute Pulse rhythm:   regular BP sitting:   140 / 80  (left arm) Cuff size:   regular  Vitals Entered By: Benny Lennert CMA Duncan Dull) (February 10, 2010 10:37 AM)  History of Present Illness: Chief complaint 3 month follow up  Lipids: unable to tolerate Zocor, having myalgias. Willing to take a different statin at a lower dose  Thyroid: Hair is falling out, gainging weight. Walking a lot. Gaining weight. Tired all the time.  Feels like her thyroid is low -- feels exactly like this when levels are off.  ROS: as above, cont to have parasthesias, dizziness, mulitple other c/o. Large work-up by neuro. All negative. d/c CA meds for a couple of months, no difference, wants to go back on them  Clinical Review Panels:  Lipid Management   Cholesterol:  176 (01/30/2010)   LDL (bad choesterol):  71 (01/30/2010)   HDL (good cholesterol):  79.10 (01/30/2010)  CBC   WBC:  7.8 (09/11/2009)   RBC:  3.94 (09/11/2009)   Hgb:  13.0 (09/11/2009)   Hct:  39.3 (09/11/2009)   Platelets:  282 (09/11/2009)   MCV  99.7 (09/11/2009)   MCHC  33.1 (09/11/2009)   RDW  12.9 (09/11/2009)   PMN:  55 (09/11/2009)   Lymphs:  37 (09/11/2009)   Monos:  6 (09/11/2009)   Eosinophils:  2 (09/11/2009)   Basophil:  0 (09/11/2009)  Complete Metabolic Panel   Glucose:  87 (01/30/2010)   Sodium:  142 (01/30/2010)   Potassium:  4.3 (01/30/2010)   Chloride:  104 (01/30/2010)   CO2:  29 (01/30/2010)   BUN:  15 (01/30/2010)   Creatinine:  0.7 (01/30/2010)   Albumin:  4.1 (01/30/2010)   Total Protein:  8.0 (01/30/2010)   Calcium:  9.6 (01/30/2010)   Total Bili:  0.7 (01/30/2010)   Alk Phos:  75 (01/30/2010)   SGPT (ALT):  13 (01/30/2010)   SGOT (AST):  19 (01/30/2010)   Allergies (verified): No Known  Drug Allergies  Past History:  Past medical, surgical, family and social histories (including risk factors) reviewed, and no changes noted (except as noted below).  Past Medical History: TIA vs. neuropathic symptoms, dizziness -- ? etiology 08/2009 BREAST CANCER, HX OF (ICD-V10.3) UTI'S, HX OF (ICD-V13.00) DIVERTICULITIS, HX OF (ICD-V12.79) ANEMIA-NOS (ICD-285.9) Hypothyroidism restless leg syndrome ppossible history of  Reiter syndrome, diagnosed in [place] years ago by rheumatology Hyperlipidemia  Past Surgical History: Reviewed history from 06/17/2009 and no changes required. Mastectomy 2009 Ovary 2006 Gallbladder 2008 Breast Biopsy 2009 Hysterectomy 1976  Family History: Reviewed history from 06/17/2009 and no changes required. Family History of Alcoholism/Addiction, GP, Other Family History of Arthritis, P- Family History Lung cancer, P Family History Ovarian cancer, P Family History Uterine cancer, P  Social History: Reviewed history from 06/17/2009 and no changes required. Never Smoked Alcohol use-no Drug use-no Regular exercise-yes  Review of Systems      See HPI General:  See HPI; Complains of fatigue; denies fever. Neuro:  Complains of disturbances in coordination, falling down, poor balance, and tingling.  Physical Exam  Additional Exam:  GEN: WDWN, NAD, Non-toxic, A & O x 3 HEENT: Atraumatic, Normocephalic. Neck supple. No masses,  No LAD. Ears and Nose: No external deformity. CV: RRR, No M/G/R. No JVD. No thrill. No extra heart sounds. PULM: CTA B, no wheezes, crackles, rhonchi. No retractions. No resp. distress. No accessory muscle use. EXTR: No c/c/e NEURO: Normal gait.  PSYCH: Normally interactive. Conversant. Not depressed or anxious appearing.  Calm demeanor.     Impression & Recommendations:  Problem # 1:  HYPOTHYROIDISM (ICD-244.9) Assessment Deteriorated Clinically, the patient seems to be hypothyroid - ? T3 make small change and  recheck in 1 month  Her updated medication list for this problem includes:    Levothyroxine Sodium 125 Mcg Tabs (Levothyroxine sodium) .Marland Kitchen... 1 by mouth daily  Problem # 2:  HYPERLIPIDEMIA (ICD-272.4) Assessment: Deteriorated Myalgias - d/c statin change to pravachol  Her updated medication list for this problem includes:    Pravastatin Sodium 10 Mg Tabs (Pravastatin sodium) ..... One by mouth at bedtime  Complete Medication List: 1)  Arimidex 1 Mg Tabs (Anastrozole) .... Take 1 tablet by mouth once a day 2)  Zoloft 100 Mg Tabs (Sertraline hcl) .... Take 1 tablet by mouth once a day 3)  Levothyroxine Sodium 125 Mcg Tabs (Levothyroxine sodium) .Marland Kitchen.. 1 by mouth daily 4)  Celebrex 200 Mg Caps (Celecoxib) .... Take 2 capsules by mouth two times a day 5)  Cyclobenzaprine Hcl 10 Mg Tabs (Cyclobenzaprine hcl) .... Take 1/2 to 1 tablet by mouth as needed for muscle spasms 6)  Pravastatin Sodium 10 Mg Tabs (Pravastatin sodium) .... One by mouth at bedtime  Patient Instructions: 1)  LABWORK IN 1 MONTH 2)  TSH, Total T4, Free T4, Free T3: 244.9 Prescriptions: LEVOTHYROXINE SODIUM 125 MCG TABS (LEVOTHYROXINE SODIUM) 1 by mouth daily  #30 x 11   Entered and Authorized by:   [name] MD   Signed by:   [name] MD on 02/10/2010   Method used:   Electronically to        [facility]* (retail)       [address]       [place], [place]  [address]       Ph: [phone]       Fax: [phone]   RxID:   [id number] PRAVASTATIN SODIUM 10 MG  TABS (PRAVASTATIN SODIUM) one by mouth at bedtime  #30 x 11   Entered and Authorized by:   [name] MD   Signed by:   [name] MD on 02/10/2010   Method used:   Electronically to        [facility]* (retail)       [address]       [place], [place]  [address]       Ph: [phone]       Fax: [phone]   RxID:   [id number]   Current Allergies (reviewed today): No known allergies

## 2010-11-18 NOTE — Progress Notes (Signed)
  Phone Note From Other Clinic   Caller: Nurse Details for Reason: Medical Clearance Letter Summary of Call: Received a call from [facility] office of Dr Dwain Sarna from [place]. They faxed a request form to Dr Patsy Lager needing medical clearance letter from him in order to schedule this patient for Hernia surgery. They faxed it over on 11/18/2009, it is in your in box on your desk. Told them Dr Patsy Lager was not in the office that he would be returning tommorrow 11/21/2009. Initial call taken by: Carlton Adam,  November 20, 2009 3:35 PM  Follow-up for Phone Call        Noted. Follow-up by: [name] MD,  November 20, 2009 7:16 PM

## 2010-11-18 NOTE — Letter (Signed)
Summary: [facility]  [facility]   Imported By: Sherian Rein 11/25/2009 14:04:49  _____________________________________________________________________  External Attachment:    Type:   Image     Comment:   External Document

## 2010-11-18 NOTE — Progress Notes (Signed)
Summary: Celebrex  Phone Note Refill Request Message from:  Fax from Pharmacy on March 19, 2010 11:51 AM  Refills Requested: Medication #1:  CELEBREX 200 MG CAPS Take 2 capsules by mouth two times a day [facility]   Method Requested: Electronic Initial call taken by: Delilah Shan CMA Duncan Dull),  March 19, 2010 11:51 AM  Follow-up for Phone Call        [facility]  check her Celebrex dosing written as 2 tabs two times a day   this is higher than it is supposed to be and would hav e to be altered..  double check str and dose Follow-up by: [name] MD,  March 19, 2010 1:49 PM  Additional Follow-up for Phone Call Additional follow up Details #1::        sp[facility] with casey at [facility] this is the correct dose Additional Follow-up by: Benny Lennert CMA Duncan Dull),  March 19, 2010 2:38 PM    Additional Follow-up for Phone Call Additional follow up Details #2::    No, this is too high.  OK to refill, 200 mg, 1 by mouth two times a day. #60, 5 refills  Long term, 2 by mouth two times a day dramatically increases risk for multiple complications.   200 mg two times a day is at the highest dose level Follow-up by: [name] MD,  March 19, 2010 2:44 PM  New/Updated Medications: CELEBREX 200 MG CAPS (CELECOXIB) Take 1 capsules by mouth two times a day Prescriptions: CELEBREX 200 MG CAPS (CELECOXIB) Take 1 capsules by mouth two times a day  #60 x 5   Entered by:   Benny Lennert CMA (AAMA)   Authorized by:   [name] MD   Signed by:   Benny Lennert CMA (AAMA) on 03/19/2010   Method used:   Electronically to        [facility]* (retail)       [address]       [place], [place]  [address]       Ph: [phone]       Fax: [phone]   RxID:   [id number]

## 2010-11-18 NOTE — Progress Notes (Signed)
Summary: refill request for celebrex  Phone Note Refill Request Message from:  Fax from Pharmacy  Refills Requested: Medication #1:  CELEBREX 200 MG CAPS Take 1 capsules by mouth two times a day   Last Refilled: 01/28/2010 Faxed request from [facility].  Initial call taken by: Lowella Petties CMA,  March 19, 2010 3:56 PM  Follow-up for Phone Call        already filled. Follow-up by: [name] MD,  March 19, 2010 3:59 PM

## 2010-11-18 NOTE — Consult Note (Signed)
Summary: [facility]  [facility]   Imported By: Lanelle Bal 11/16/2009 10:20:39  _____________________________________________________________________  External Attachment:    Type:   Image     Comment:   External Document

## 2010-11-18 NOTE — Letter (Signed)
Summary: [place] No Show Letter  [place] at [facility]  [address] [place], [place] [address]   Phone: [phone]  Fax: [phone]    01/27/2010 MRN: [id number]  SALISA BROZ [address] [place], [place]  [address]   Dear Ms. Janee Morn,   Our records indicate that you missed your scheduled appointment with ____lab_________________ on __4.11.11__________.  Please contact this office to reschedule your appointment as soon as possible.  It is important that you keep your scheduled appointments with your physician, so we can provide you the best care possible.  Please be advised that there may be a charge for "no show" appointments.    Sincerely,   [place] at [facility]

## 2010-11-18 NOTE — Assessment & Plan Note (Signed)
Summary: ROA  RESCHEDULED FR BUMP LIST CYD   Vital Signs:  Patient profile:   65 year old female Height:      62.75 inches Weight:      165.4 pounds BMI:     29.64 Temp:     98.0 degrees F oral Pulse rate:   76 / minute Pulse rhythm:   regular BP sitting:   140 / 90  (left arm) Cuff size:   regular  Vitals Entered By: Benny Lennert CMA Duncan Dull) (October 28, 2009 9:49 AM)  History of Present Illness: Chief complaint follow up   65 year old, complex case:  last time I saw the patient she was having some discrete burning and tingling sensations in bilateral upper extremities and lower extremities. She also had an episode of some chin tingling. She also had one episode where she developed some blurred vision, and  those symptoms  resolved within a day.  She has had an extensive workup thus far, and she has had a negative MRI of the brain,  negative carotid  ultrasound, negative echocardiogram.  Working diagnosis currently is  history of acute TIA  Additionally, she was having some intermittent vertigo with the room spinning.  At that time, she was referred for vestibular rehabilitation,, and has made minimal progress thus far.  Again, MRI of the brain is negative.  Prednisone caused more dizziness, not having pain as bad in her neck. she saw Dr. Alvester Morin, and was placed on a prednisone taper, which did  dramatically improve her arthritis, however did not resolve or dizziness, created more dizziness, and did nothing to alleviate her burning and paresthesias.  Dr. Lovell Sheehan - [facility]: Her daughter sees Dr. Lovell Sheehan, and he is requested to see the patient in evaluation.  MRI, cervical spine, independently reviewed.  They're diffuse spondylotic changes  from approximately C3-T1, however there is no focal  large or severe lesion. There is no evidence for spinal cord edema.  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  Past Medical History: TIA, 08/2009 BREAST CANCER, HX OF (ICD-V10.3) UTI'S, HX OF (ICD-V13.00) DIVERTICULITIS, HX OF (ICD-V12.79) ANEMIA-NOS (ICD-285.9) Hypothyroidism restless leg syndrome ppossible history of  Reiter syndrome, diagnosed in [place] years ago by rheumatology Hyperlipidemia  Past Surgical History: Reviewed history from 06/17/2009 and no changes required. Mastectomy 2009 Ovary 2006 Gallbladder 2008 Breast Biopsy 2009 Hysterectomy 1976  Family History: Reviewed history from 06/17/2009 and no changes required. Family History of Alcoholism/Addiction, GP, Other Family History of Arthritis, P- Family History Lung cancer, P Family History Ovarian cancer, P Family History Uterine cancer, P  Social History: Reviewed history from 06/17/2009 and no changes required. Never Smoked Alcohol use-no Drug use-no Regular exercise-yes  Review of Systems       as above No fever, chills, sweats, shortness of breath, chest pain. Continues with neurological symptoms.  Physical Exam  General:  Well-developed,well-nourished,in no acute distress; alert,appropriate and cooperative throughout examination Head:  Normocephalic and atraumatic without obvious abnormalities. No apparent alopecia or balding. Eyes:  vision grossly intact.   Ears:  no external deformities.   Nose:  no external deformity.   Neck:  No deformities, masses, or tenderness noted. Lungs:  Normal respiratory effort, chest expands symmetrically. Lungs are clear to auscultation, no  crackles or wheezes. Heart:  Normal rate and regular rhythm. S1 and S2 normal without gallop, murmur, click, rub or other extra sounds. Extremities:  No clubbing, cyanosis, edema, or deformity noted with normal full range of  motion of all joints.   Neurologic:  alert & oriented X3 and gait normal.   Cervical Nodes:  No lymphadenopathy noted Psych:  Cognition and judgment appear intact. Alert and cooperative with normal attention span and concentration. 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 I have reviewed Dr. [place] notes as well, and I agree with him that this is a case where there certainly could be multiple  nerve impingements involved, however the patient has diffuse complaints. In multiple limb.  Patient does have evidence of some degree  of cervical disease, however I am unclear that this may be a focal lesion at least that is causing all of her symptoms. Her MRIs are reassuring, however,  I am concerned that there could be another underlying diagnosis.  I appreciate their input  the patient requests  consultation from  Dr. Lovell Sheehan, and I think this is reasonable. In this case,  I would proceed quite cautiously  with any  operative intervention.  Orders: Neurology Referral (Neuro)  Problem # 2:  VERTIGO (ICD-780.4) the vertigo continues without improvement from vestibular rehabilitation..  Again another symptom that he needs further evaluation,, and I appreciate neurology input.  Problem # 3:  TRANSIENT ISCHEMIC ATTACK (ICD-435.9) prior to her last office visit, symptoms most consistent with TIA. there is no evidence for MS on her brain MRI. and there is no evidence for metastatic brain tumor.  aspirin 81 mg daily.  Control blood pressure and lipids.  Orders: Neurology Referral (Neuro)  Problem # 4:  HYPERLIPIDEMIA (ICD-272.4) Assessment: New TIAs coronary equivalent, goal LDL 70  Her updated medication list for this problem includes:    Simvastatin 40 Mg Tabs (Simvastatin) .Marland Kitchen... Take one tablet at bedtime  Complete  Medication List: 1)  Arimidex 1 Mg Tabs (Anastrozole) .... Take 1 tablet by mouth once a day 2)  Zoloft 100 Mg Tabs (Sertraline hcl) .... Take 1 tablet by mouth once a day 3)  Synthroid 112 Mcg Tabs (Levothyroxine sodium) .... Take 1 tablet by mouth once a day 4)  Celebrex 200 Mg Caps (Celecoxib) .... Take 2 capsules by mouth two times a day 5)  Cyclobenzaprine Hcl 10 Mg Tabs (Cyclobenzaprine hcl) .... Take 1/2 to 1 tablet by mouth as needed for muscle spasms 6)  Diazepam 5 Mg Tabs (Diazepam) .Marland Kitchen.. 1 by mouth 30 minutes prior to mri 7)  Simvastatin 40 Mg Tabs (Simvastatin) .... Take one tablet at bedtime  Patient Instructions: 1)  Referral Appointment Information 2)  Day/Date: 3)  Time: 4)  Place/MD: 5)  Address: 6)  Phone/Fax: 7)  Patient given appointment information. Information/Orders faxed/mailed.  8)  3 months. fasting a few days before 9)  Hepatic Panel prior to visit ICD-9:  v58.69 10)  Lipid panel prior to visit ICD-9 : 272.4 11)  TSH, Total T4, Free T4: 242.90 12)  BMP: v58.69 Prescriptions: SIMVASTATIN 40 MG TABS (SIMVASTATIN) Take one tablet at bedtime  #30 x 11   Entered and Authorized by:   [name] MD   Signed by:   [name] MD on 10/28/2009   Method used:   Electronically to  [facility]* (retail)       [address]       [place], [place]  [address]       Ph: [phone]       Fax: [phone]   RxID:   [id number]   Current Allergies (reviewed today): No known allergies

## 2010-11-18 NOTE — Letter (Signed)
[facility]   Imported By: Maryln Gottron 06/03/2010 12:46:53  _____________________________________________________________________  External Attachment:    Type:   Image     Comment:   External Document

## 2010-11-18 NOTE — Progress Notes (Signed)
Summary: celbrex not helping  Phone Note Call from Patient Call back at Home Phone [phone]   Caller: Patient Call For: Shannon Beat MD Summary of Call: Patient says that she has been taking celbrex for 15 year or more and she feels that it is not working of her anymore. She says that she has been having alot of pain and discomfort with her arthritis and wants to know if there is something else she could try. Please advise. Uses [facility].  Initial call taken by: Melody Comas,  June 25, 2010 1:05 PM  Follow-up for Phone Call        ok to d/c celebrex  Try Mobic 15 mg, 1 by mouth daily, #30, 11 refills  there are other things potentially, we can discuss when next in the office  call in Follow-up by: Shannon Beat MD,  June 25, 2010 1:27 PM  Additional Follow-up for Phone Call Additional follow up Details #1::        Advised spouse as instructed via telephone.  F/u appt made for 07/23/2010.  Rx for Mobic called to [facility]. Additional Follow-up by: Linde Gillis CMA Duncan Dull),  June 25, 2010 2:31 PM     Appended Document: Med/Alg Import    Medications Added MOBIC 15 MG TABS (MELOXICAM) take one tablet by mouth daily      Allergies Added: NKDA Current Allergies (reviewed today): No known allergies

## 2010-11-20 NOTE — Assessment & Plan Note (Signed)
Summary: RIGHT KNEE PAIN/CLE   Vital Signs:  Patient profile:   65 year old female Height:      62.75 inches Weight:      175.50 pounds BMI:     31.45 Temp:     98.1 degrees F oral Pulse rate:   76 / minute Pulse rhythm:   regular BP sitting:   140 / 88  (left arm) Cuff size:   regular  Vitals Entered By: Benny Lennert CMA Duncan Dull) (October 29, 2010 9:20 AM)  History of Present Illness: Chief complaint right knee pain  Hit knee, anteriolateral. Twice in [place], and pain with kneeling on   pleasant patient presents with right knee pain, after she fell and traumatized and anterior lateral aspect of her knee in November. she continues to have persistent pain, primarily with terminal flexion.  No pain with walking or standing on her knee. No pain in the anterior posterior to the patella. No pain going up and down stairs. No symptomatic giving way or locking up of her knee.  She is on some Mobic, and actually is additionally taking some Aleve 2 tablets twice a day   pain: Multiple other complaints including pain in around her postsurgical sites, should discuss this with both her general surgeon and her plastic surgeon. They have discussed the potential diagnosis of fibromyalgia with the patient. She'll have a classic multi-diffuse pain syndrome such as fibromyalgia. This is primarily in around incision sites and at the area of her surgery. As all the anterior portion of her thorax and into her lower abdomen.  Review of systems as above. No fevers or chills. Occasionally does not feel well. As above. She also will occasionally have some small skin rash that she develops in around her incisional sites. Her surgeons have been monitoring these closely.  Allergies (verified): No Known Drug Allergies  Past History:  Past medical, surgical, family and social histories (including risk factors) reviewed, and no changes noted (except as noted below).  Past Medical History: Reviewed  history from 02/10/2010 and no changes required. TIA vs. neuropathic symptoms, dizziness -- ? etiology 08/2009 BREAST CANCER, HX OF (ICD-V10.3) UTI'S, HX OF (ICD-V13.00) DIVERTICULITIS, HX OF (ICD-V12.79) ANEMIA-NOS (ICD-285.9) Hypothyroidism restless leg syndrome ppossible history of  Reiter syndrome, diagnosed in [place] years ago by rheumatology Hyperlipidemia  Past Surgical History: Reviewed history from 06/17/2009 and no changes required. Mastectomy 2009 Ovary 2006 Gallbladder 2008 Breast Biopsy 2009 Hysterectomy 1976  Family History: Reviewed history from 06/17/2009 and no changes required. Family History of Alcoholism/Addiction, GP, Other Family History of Arthritis, P- Family History Lung cancer, P Family History Ovarian cancer, P Family History Uterine cancer, P  Social History: Reviewed history from 06/17/2009 and no changes required. Never Smoked Alcohol use-no Drug use-no Regular exercise-yes  Physical Exam  General:  GEN: Well-developed,well-nourished,in no acute distress; alert,appropriate and cooperative throughout examination HEENT: Normocephalic and atraumatic without obvious abnormalities. No apparent alopecia or balding. Ears, externally no deformities PULM: Breathing comfortably in no respiratory distress EXT: No clubbing, cyanosis, or edema PSYCH: Normally interactive. Cooperative during the interview. Pleasant. Friendly and conversant. Not anxious or depressed appearing. Normal, full affect.  Msk:  Gait: Normal heel toe pattern ROM: WNL Effusion: neg Echymosis or edema: none Patellar tendon distal insertion ttp and just lateral to this Painful PLICA: neg Patellar grind: negative Medial and lateral patellar facet loading: negative medial and lateral joint lines:NT Mcmurray's neg Flexion-pinch neg Varus and valgus stress: stable Lachman: neg Ant and Post drawer: neg Hip  abduction, IR, ER: WNL Hip flexion str: 5/5 Hip abd: 5/5 Quad:  5/5 VMO atrophy:No Hamstring concentric and eccentric: 5/5    Impression & Recommendations:  Problem # 1:  TENDINITIS, PATELLAR (ICD-726.64) Assessment New traumatic patellar tendinitis, exertional. Additionally suspect bone bruise.  X-rays: AP Bilateral Weight-bearing, Weightbearing Lateral, Sunrise views Indication: knee pain Findings:  mild oa medially only  I reviewed with the patient a handout from [employer] and [facility] regarding their condition and a set of home exercises. They indicated that they understood what was discussed with them. All questions answered.   voltaren gel suspect resolution with time and rehab   Orders: T-Knee Right 2 view (73560TC) T-DG Knee Bilateral Standing AP (91478)  Problem # 2:  DEGENERATIVE JOINT DISEASE (ICD-715.90) Assessment: New  Her updated medication list for this problem includes:    Mobic 15 Mg Tabs (Meloxicam) .Marland Kitchen... 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 I'm not sure if this goes along with fibromyalgia. These seem to go along in her post surgical areas, and I would suspect that the diagnosis would most correlate with reflex sympathetic dystrophy or complex regional pain syndrome. I am going to start the patient on cyclobenzaprine p.o. q.h.s. She is already on zoloft  Complete Medication List: 1)  Arimidex 1 Mg Tabs (Anastrozole) .... Take 1 tablet by mouth once a day 2)  Zoloft 100 Mg Tabs (Sertraline hcl) .... Take 1 tablet by mouth once a day 3)  Levothyroxine Sodium 125 Mcg Tabs (Levothyroxine sodium) .Marland Kitchen.. 1 by mouth daily 4)  Cyclobenzaprine Hcl 10 Mg Tabs (Cyclobenzaprine hcl) .... Take 1/2 to 1 tablet by mouth as needed for muscle spasms 5)  Levothyroxine Sodium 112 Mcg Tabs (Levothyroxine sodium) .Marland Kitchen.. 1 by mouth every other day 6)   Mobic 15 Mg Tabs (Meloxicam) .... Take one tablet by mouth daily 7)  Voltaren 1 % Gel (Diclofenac sodium) .... Apply 4 times daily to affected area  Patient Instructions: 1)  f/u 4-6 weeks (if better OK to not f/u) Prescriptions: CYCLOBENZAPRINE HCL 10 MG TABS (CYCLOBENZAPRINE HCL) Take 1/2 to 1 tablet by mouth as needed for muscle spasms  #30 x 3   Entered and Authorized by:   [name] MD   Signed by:   [name] MD on 10/29/2010   Method used:   Electronically to        [facility]* (retail)       [address]       [place], [place]  [address]       Ph: [phone]       Fax: [phone]   RxID:   [id number] VOLTAREN 1 %  GEL (DICLOFENAC SODIUM) Apply 4 times daily to affected area  #5 tubes x 11   Entered and Authorized by:   [name] MD   Signed by:   [name] MD on 10/29/2010   Method used:   Electronically to        [facility]* (retail)       [address]       [place], [place]  [address]       Ph: [phone]       Fax: [phone]   RxID:   [id number]    Orders Added: 1)  T-Knee Right 2 view [73560TC] 2)  T-DG Knee Bilateral Standing AP [73565] 3)  Est. Patient Level IV [id number]    Current Allergies (reviewed today): No known allergies

## 2010-11-27 ENCOUNTER — Encounter: Payer: Self-pay | Admitting: Family Medicine

## 2010-12-04 NOTE — Letter (Signed)
Summary: [facility]  [facility]   Imported By: Kassie Mends 11/26/2010 10:21:39  _____________________________________________________________________  External Attachment:    Type:   Image     Comment:   External Document

## 2010-12-08 ENCOUNTER — Encounter: Payer: Self-pay | Admitting: Family Medicine

## 2010-12-08 ENCOUNTER — Ambulatory Visit (INDEPENDENT_AMBULATORY_CARE_PROVIDER_SITE_OTHER): Payer: BC Managed Care – PPO | Admitting: Family Medicine

## 2010-12-08 DIAGNOSIS — J069 Acute upper respiratory infection, unspecified: Secondary | ICD-10-CM

## 2010-12-16 NOTE — Assessment & Plan Note (Signed)
Summary: UPPER RESP PROBLEMS AND TEMP   Vital Signs:  Patient profile:   65 year old female Weight:      171 pounds Temp:     98.8 degrees F oral BP sitting:   140 / 88  (left arm) Cuff size:   regular  Vitals Entered By: Mervin Hack CMA Duncan Dull) (December 08, 2010 9:41 AM) CC: upper resp problems   History of Present Illness: 65 yo pleasant female new to me here for URI.  H/o breast CA and pneumonia in past. pneumovax UTD.  Almost one week ago, developped URI symptoms, runny nose, cough. Now "feels like its in her chest."  Cough is productive, had subjective fever and chills yesterday and last night. No CP or CP.  Taking OTC decongestant. No sick contacts.  Current Medications (verified): 1)  Arimidex 1 Mg Tabs (Anastrozole) .... Take 1 Tablet By Mouth Once A Day 2)  Zoloft 100 Mg Tabs (Sertraline Hcl) .... Take 1 Tablet By Mouth Once A Day 3)  Levothyroxine Sodium 125 Mcg Tabs (Levothyroxine Sodium) .Marland Kitchen.. 1 By Mouth Daily 4)  Cyclobenzaprine Hcl 10 Mg Tabs (Cyclobenzaprine Hcl) .... Take 1/2 To 1 Tablet By Mouth As Needed For Muscle Spasms 5)  Levothyroxine Sodium 112 Mcg  Tabs (Levothyroxine Sodium) .Marland Kitchen.. 1 By Mouth Every Other Day 6)  Mobic 15 Mg Tabs (Meloxicam) .... Take One Tablet By Mouth Daily 7)  Voltaren 1 %  Gel (Diclofenac Sodium) .... Apply 4 Times Daily To Affected Area 8)  Azithromycin 250 Mg  Tabs (Azithromycin) .... 2 By  Mouth Today and Then 1 Daily For 4 Days 9)  Tessalon Perles 100 Mg  Caps (Benzonatate) .Marland Kitchen.. 1 Tab By Mouth Three Times A Day As Needed Cough.  Allergies (verified): No Known Drug Allergies  Past History:  Past Medical History: Last updated: 02/10/2010 TIA vs. neuropathic symptoms, dizziness -- ? etiology 08/2009 BREAST CANCER, HX OF (ICD-V10.3) UTI'S, HX OF (ICD-V13.00) DIVERTICULITIS, HX OF (ICD-V12.79) ANEMIA-NOS (ICD-285.9) Hypothyroidism restless leg syndrome ppossible history of  Reiter syndrome, diagnosed in [place]  years ago by rheumatology Hyperlipidemia  Past Surgical History: Last updated: 06/17/2009 Mastectomy 2009 Ovary 2006 Gallbladder 2008 Breast Biopsy 2009 Hysterectomy 1976  Family History: Last updated: 06/17/2009 Family History of Alcoholism/Addiction, GP, Other Family History of Arthritis, P- Family History Lung cancer, P Family History Ovarian cancer, P Family History Uterine cancer, P  Social History: Last updated: 06/17/2009 Never Smoked Alcohol use-no Drug use-no Regular exercise-yes  Risk Factors: Exercise: yes (06/17/2009)  Risk Factors: Smoking Status: never (06/17/2009)  Review of Systems      See HPI General:  Complains of chills and fever. ENT:  Complains of nasal congestion, sinus pressure, and sore throat. Resp:  Complains of cough and sputum productive; denies shortness of breath and wheezing. GI:  Denies abdominal pain and change in bowel habits.  Physical Exam  General:  alert and well-developed.   VSS, non toxic appearing Nose:  boggy turbinates Mouth:  pharyngeal erythema.   Lungs:  Normal respiratory effort, chest expands symmetrically. Lungs are clear to auscultation, no crackles or wheezes. Heart:  Normal rate and regular rhythm. S1 and S2 normal without gallop, murmur, click, rub or other extra sounds. Extremities:  No clubbing, cyanosis, edema, or deformity noted with normal full range of motion of all joints.   Neurologic:  alert & oriented X3 and gait normal.   Psych:  Cognition and judgment appear intact. Alert and cooperative with normal attention span and concentration. No apparent  delusions, illusions, hallucinations   Impression & Recommendations:  Problem # 1:  URI (ICD-465.9) Assessment New Lung clear on exam but given h/o breast CA, I would prefer not to exposure her to radiation of another CXR. Given fever and change in sputum, will cover for bacterial process with Zpack. Tessalon as needed cough. Her updated medication list  for this problem includes:    Mobic 15 Mg Tabs (Meloxicam) .Marland Kitchen... Take one tablet by mouth daily    Tessalon Perles 100 Mg Caps (Benzonatate) .Marland Kitchen... 1 tab by mouth three times a day as needed cough.  Complete Medication List: 1)  Arimidex 1 Mg Tabs (Anastrozole) .... Take 1 tablet by mouth once a day 2)  Zoloft 100 Mg Tabs (Sertraline hcl) .... Take 1 tablet by mouth once a day 3)  Levothyroxine Sodium 125 Mcg Tabs (Levothyroxine sodium) .Marland Kitchen.. 1 by mouth daily 4)  Cyclobenzaprine Hcl 10 Mg Tabs (Cyclobenzaprine hcl) .... Take 1/2 to 1 tablet by mouth as needed for muscle spasms 5)  Levothyroxine Sodium 112 Mcg Tabs (Levothyroxine sodium) .Marland Kitchen.. 1 by mouth every other day 6)  Mobic 15 Mg Tabs (Meloxicam) .... Take one tablet by mouth daily 7)  Voltaren 1 % Gel (Diclofenac sodium) .... Apply 4 times daily to affected area 8)  Azithromycin 250 Mg Tabs (Azithromycin) .... 2 by  mouth today and then 1 daily for 4 days 9)  Tessalon Perles 100 Mg Caps (Benzonatate) .Marland Kitchen.. 1 tab by mouth three times a day as needed cough. Prescriptions: TESSALON PERLES 100 MG  CAPS (BENZONATATE) 1 tab by mouth three times a day as needed cough.  #30 x 0   Entered and Authorized by:   Ruthe Mannan MD   Signed by:   Ruthe Mannan MD on 12/08/2010   Method used:   Electronically to        [facility]* (retail)       [address]       [place], [place]  [address]       Ph: [phone]       Fax: [phone]   RxID:   [id number] AZITHROMYCIN 250 MG  TABS (AZITHROMYCIN) 2 by  mouth today and then 1 daily for 4 days  #6 x 0   Entered and Authorized by:   Ruthe Mannan MD   Signed by:   Ruthe Mannan MD on 12/08/2010   Method used:   Electronically to        [facility]* (retail)       [address]       [place], [place]  [address]       Ph: [phone]       Fax: [phone]   RxID:   [id number]    Orders Added: 1)  Est. Patient Level IV [32355]    Current Allergies (reviewed today): No known  allergies

## 2011-01-07 LAB — BASIC METABOLIC PANEL
BUN: 17 mg/dL (ref 6–23)
GFR calc Af Amer: >60 mL/min (ref >60)
GFR calc non Af Amer: >60 mL/min (ref >60)
Potassium: 4.2 mEq/L (ref 3.5–5.1)

## 2011-01-07 LAB — CBC
HCT: 37.9 % (ref 36.0–46.0)
Platelets: 224 10*3/uL (ref 150–400)
RBC: 3.83 MIL/uL — ABNORMAL LOW (ref 3.87–5.11)
WBC: 8.2 10*3/uL (ref 4.0–10.5)

## 2011-01-07 LAB — DIFFERENTIAL
Eosinophils Relative: 2 % (ref 0–5)
Lymphocytes Relative: 35 % (ref 12–46)
Lymphs Abs: 2.9 10*3/uL (ref 0.7–4.0)
Monocytes Relative: 8 % (ref 3–12)
Neutrophils Relative %: 55 % (ref 43–77)

## 2011-01-19 ENCOUNTER — Ambulatory Visit (INDEPENDENT_AMBULATORY_CARE_PROVIDER_SITE_OTHER): Payer: BC Managed Care – PPO | Admitting: Family Medicine

## 2011-01-19 ENCOUNTER — Encounter: Payer: Self-pay | Admitting: Family Medicine

## 2011-01-19 VITALS — BP 160/100 | HR 64 | Temp 98.6°F | Ht 63.0 in | Wt 176.0 lb

## 2011-01-19 DIAGNOSIS — E039 Hypothyroidism, unspecified: Secondary | ICD-10-CM

## 2011-01-19 DIAGNOSIS — E785 Hyperlipidemia, unspecified: Secondary | ICD-10-CM

## 2011-01-19 DIAGNOSIS — M7650 Patellar tendinitis, unspecified knee: Secondary | ICD-10-CM

## 2011-01-19 LAB — TSH: TSH: 0.39 u[IU]/mL (ref 0.35–5.50)

## 2011-01-19 LAB — T4, FREE: Free T4: 1.1 ng/dL (ref 0.60–1.60)

## 2011-01-19 MED ORDER — NITROGLYCERIN 0.2 MG/HR TD PT24: MEDICATED_PATCH | TRANSDERMAL | Status: DC

## 2011-01-19 NOTE — Patient Instructions (Signed)
F/u depends some on labs, but if everything fine, f/u 6 months

## 2011-01-19 NOTE — Progress Notes (Signed)
  Subjective:    Patient ID: Shannon [name], female    DOB: 05/27/1946, 65 y.o.   MRN: [id number]  HPI  1. R knee, feeling somewhat better, sometimes will get some sharp shooting Happened in December Anterior knee pain Patient presents with 4 month h/o r sided knee pain after no occult injury. No audible pop was heard. The patient has not had an effusion. No symptomatic giving-way. No mechanical clicking. Joint has not locked up. The patient does have pain going up and down stairs or rising from a seated position.   Pain location: anterior Current physical activity: minimal Prior Knee Surgery: none Current pain meds: mobic Bracing: none  Still having some pain in and around her breastbone  Now having back pain  2. Thyroid: No symptoms. Labs reviewed. Denies cold / heat intolerance, dry skin, hair loss. No goiter.  Lab Results  Component Value Date   TSH 15.27* 07/21/2010   3.  Lipids: Doing well, stable. Tolerating meds fine with no SE. Panel reviewed with patient.  Lab Results  Component Value Date   CHOL 176 01/30/2010   Lab Results  Component Value Date   HDL 79.10 01/30/2010   Lab Results  Component Value Date   LDLCALC 71 01/30/2010   Lab Results  Component Value Date   TRIG 130.0 01/30/2010   Lab Results  Component Value Date   CHOLHDL 2 01/30/2010    Lab Results  Component Value Date   ALT 13 11/13/2010   AST 17 11/13/2010   ALKPHOS 87 11/13/2010   BILITOT 0.7 11/13/2010     Review of Systems REVIEW OF SYSTEMS  GEN: No fevers, chills. Nontoxic. Primarily MSK c/o today. MSK: Detailed in the HPI GI: tolerating PO intake without difficulty Neuro: No numbness, parasthesias, or tingling associated. Otherwise the pertinent positives of the ROS are noted above.       Objective:   Physical Exam    GEN: Well-developed,well-nourished,in no acute distress; alert,appropriate and cooperative throughout examination HEENT: Normocephalic and atraumatic without  obvious abnormalities. Ears, externally no deformities CV: RRR, no m/g/r PULM: Breathing comfortably in no respiratory distress, CTA B EXT: No clubbing, cyanosis, or edema PSYCH: Normally interactive. Cooperative during the interview. Pleasant. Friendly and conversant. Not anxious or depressed appearing. Normal, full affect.  Gait: Normal heel toe pattern ROM: WNL Effusion: neg Echymosis or edema: none Patellar tendon ttp MILD T AT PES Painful PLICA: neg Patellar grind: negative Medial and lateral patellar facet loading: negative medial and lateral joint lines:NT Mcmurray's neg Flexion-pinch neg Varus and valgus stress: stable Lachman: neg Ant and Post drawer: neg Hip abduction, IR, ER: WNL Hip flexion str: 5/5 Hip abd: 5/5 Quad: 5/5 VMO atrophy:No Hamstring concentric and eccentric: 5/5    Assessment & Plan:  Patellar Tendonitis: Using an anatomical model, I reviewed with the patent the structures involved and how they related to their diagnosis . I reviewed with the patient a handout from [employer] and [facility] regarding their condition and a set of home exercises. They indicated that they understood what was discussed with them. All questions answered.  Continue with NSAIDS Emphasized eccentrics Placed on NTG patches  Lipids, recheck LDL  Thyroid, check levels

## 2011-01-25 LAB — CBC
MCHC: 34.3 g/dL (ref 30.0–36.0)
MCV: 96.8 fL (ref 78.0–100.0)
Platelets: 268 10*3/uL (ref 150–400)

## 2011-01-25 LAB — DIFFERENTIAL
Basophils Relative: 0 % (ref 0–1)
Eosinophils Absolute: 0.1 10*3/uL (ref 0.0–0.7)
Monocytes Relative: 7 % (ref 3–12)
Neutrophils Relative %: 59 % (ref 43–77)

## 2011-01-25 LAB — PROTIME-INR
INR: 1 (ref 0.00–1.49)
Prothrombin Time: 13.7 seconds (ref 11.6–15.2)

## 2011-01-27 LAB — BASIC METABOLIC PANEL
BUN: 4 mg/dL — ABNORMAL LOW (ref 6–23)
GFR calc non Af Amer: >60 mL/min (ref >60)
Glucose, Bld: 144 mg/dL — ABNORMAL HIGH (ref 70–99)
Potassium: 3.7 mEq/L (ref 3.5–5.1)

## 2011-01-28 LAB — BASIC METABOLIC PANEL
BUN: 16 mg/dL (ref 6–23)
CO2: 28 mEq/L (ref 19–32)
Calcium: 9.4 mg/dL (ref 8.4–10.5)
Creatinine, Ser: 0.79 mg/dL (ref 0.4–1.2)
Glucose, Bld: 91 mg/dL (ref 70–99)

## 2011-01-28 LAB — URINALYSIS, ROUTINE W REFLEX MICROSCOPIC
Hgb urine dipstick: NEGATIVE
Protein, ur: NEGATIVE mg/dL
Urobilinogen, UA: 1 mg/dL (ref 0.0–1.0)

## 2011-01-28 LAB — CBC
Hemoglobin: 12.3 g/dL (ref 12.0–15.0)
MCHC: 34.8 g/dL (ref 30.0–36.0)
RDW: 12.9 % (ref 11.5–15.5)

## 2011-03-03 NOTE — Op Note (Signed)
NAME:  Shannon [name], BUCHBERGER NO.:  [id number]   MEDICAL RECORD NO.:  [id number]          PATIENT TYPE:  AMB   LOCATION:  DSC                          FACILITY:  [facility]   PHYSICIAN:  Juanetta Gosling, MDDATE OF BIRTH:  September 21, 1946   DATE OF PROCEDURE:  05/08/2009  DATE OF DISCHARGE:                               OPERATIVE REPORT   PREOPERATIVE DIAGNOSIS:  Right breast cancer status post surgery and  chemotherapy.   POSTOPERATIVE DIAGNOSIS:  Right breast cancer status post surgery and  chemotherapy.   PROCEDURE:  Left subclavian Port-A-Cath removal.   SURGEON:  Juanetta Gosling, MD   ASSISTANT:  None.   ANESTHESIA:  Local, MAC.   SPECIMENS:  Port.   ESTIMATED BLOOD LOSS:  Minimal.   COMPLICATIONS:  None.   DRAINS:  None.   DISPOSITION:  To recovery room in stable condition.   INDICATIONS:  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  Postoperatively, she underwent adjuvant  therapy by Dr. Welton Flakes with Taxotere and Cytoxan, then begun on Arimidex in  early April.  She has been undergone restore reconstruction with Dr.  Odis Luster since then.  She has been cleared to have her Port-A-Cath removed  and she and I discussed that.   PROCEDURE:  After informed consent was obtained, the patient was taken  to the operating room.  She was placed under monitored anesthesia care.  Her left chest overlying her port was prepped with ChloraPrep.  Three  minutes were allowed to pass.  She was then draped in a standard sterile  surgical fashion.  A surgical time-out was then performed.   The previous wound was infiltrated with 1% lidocaine mixed with 0.25%  Marcaine.  An incision was made.  The port in the line was then removed  in its entirety without complication.  Hemostasis was observed.  The  dermis was closed with 3-0 Vicryl.  The skin was closed with 4-0  Monocryl  in a subcuticular fashion.  Dermabond was placed over that.  She tolerated this well and was transferred to the recovery room in  stable condition.      Juanetta Gosling, MD  Electronically Signed     MCW/MEDQ  D:  05/08/2009  T:  05/08/2009  Job:  [id number]   cc:   Drue Second, MD  Etter Sjogren, M.D.

## 2011-03-03 NOTE — Op Note (Signed)
Shannon [name], Shannon [name] NO.:  [id number]   MEDICAL RECORD NO.:  [id number]          PATIENT TYPE:  OIB   LOCATION:  3310                         FACILITY:  [facility]   PHYSICIAN:  Etter Sjogren, M.D.     DATE OF BIRTH:  10-Jun-1946   DATE OF PROCEDURE:  07/27/2008  DATE OF DISCHARGE:                               OPERATIVE REPORT   PREOPERATIVE DIAGNOSIS:  Right breast cancer.   POSTOPERATIVE DIAGNOSIS:  Right breast cancer.   PROCEDURE PERFORMED:  Delay of transverse rectus abdominis myocutaneous  flap bilateral.   SURGEON:  Etter Sjogren, MD   ANESTHESIA:  General.   ESTIMATED BLOOD LOSS:  Minimal.   DRAINS:  One Harrison Mons was left in the abdominal region.   CLINICAL NOTE:  A 65 year old woman who has had right breast cancer and  would like reconstruction.  We talked about options.  She preferred to  use her own tissue and a delay of the TRAM flap was recommended.  She  understood the reason behind this and risks plus complications  associated with delay of the TRAM as well as a standard TRAM flap  surgery and these included but not limited to bleeding, infection,  anesthesia complications, healing problems, scarring, damage to  underlying intraabdominal contents, loss of sensation, loss of the flap,  and asymmetry and she wished to proceed.   DESCRIPTION OF PROCEDURE:  The patient was in the operating room.  After  successful induction of general anesthesia, she was prepped with  Betadine and draped in sterile drapes.  The marking had been placed  preoperatively for the incision down below the umbilicus was then  incised transversely and dissection carried down through the  subcutaneous tissue using electrocautery.  The underlying anterior  rectus fascia was identified and it was divided in an oblique direction  and the lateral wall of the rectus muscles were identified.  With gentle  retraction, the underlying deep inferior epigastric vessels were  identified  and were double and triple ligated and divided.  Excellent  hemostasis was confirmed.  Irrigation with saline and meticulous  hemostasis with electrocautery.  The closure of the fascia with 0  Prolene interrupted figure-of-eight sutures and some 0.25% Marcaine with  epinephrine was infiltrated into the fascia.  Irrigation again with  saline.  A Blake drain was positioned and brought out through a separate  stab wound inferolaterally and secured with a 3-0 Prolene suture.  Excellent hemostasis having been confirmed, the remainder of the  abdominal closure with 3-0 Monocryl interrupted deep dermal sutures and  running 3-0 Monocryl  subcuticular suture.  Steri-Strips, dry sterile dressing, and an  abdominal binder were applied, and she was transferred to recovery in  stable, having tolerated the procedure well.   DISPOSITION:  She will be observed overnight.      Etter Sjogren, M.D.  Electronically Signed     DB/MEDQ  D:  07/27/2008  T:  07/28/2008  Job:  [id number]

## 2011-03-03 NOTE — Discharge Summary (Signed)
NAMEEVELEIGH, CRUMPLER NO.:  [id number]   MEDICAL RECORD NO.:  [id number]          PATIENT TYPE:  INP   LOCATION:  5150                         FACILITY:  [facility]   PHYSICIAN:  Etter Sjogren, M.D.     DATE OF BIRTH:  Nov 21, 1945   DATE OF ADMISSION:  02/21/2009  DATE OF DISCHARGE:  02/24/2009                               DISCHARGE SUMMARY   FINAL DIAGNOSIS:  Right breast cancer and ptosis left breast.   PROCEDURES PERFORMED:  1. Right transverse rectus abdominis myocutaneous flap for breast      reconstruction, ipsilateral based.  2. Left mastopexy.  3. Placement of On-Q device.  4. Abdominal closure with mesh.   SUMMARY OF HISTORY AND PHYSICAL:  This is a 65 year old who has had  breast cancer and desires reconstruction, and she has already had a  delay of her TRAM flap at the time of mastectomy.  She now presents for  her procedure for reconstruction and desired a mastopexy for symmetry.  Procedure was discussed with her in great detail, and she understood  those risks and possible complications and wished to proceed.   COURSE IN THE HOSPITAL:  On admission, her admission labs were within  normal limits.  She was taken to surgery at which time right TRAM  reconstruction, left mastopexy, abdominal wall reconstruction with mesh  and On-Q catheters was placed and she tolerated that well.  Postoperatively, she did well.  She had some nausea postoperatively for  the first 24 hours and that improved.  She was continued on IV fluids.  She was ambulating, tolerating clear liquids.  By the second  postoperative day, she had a low-grade fever first 24 hours but was  afebrile for remainder of her hospital course.  Vital signs remained  stable.  Postoperative hemoglobin 9.1.  Drain was functional.  No  evidence of any bleeding.  Flap had excellent color.  Nipple complex  left side viable with pink color, and the On-Q catheter should be  removed on the third postoperative  day.  At that time, she was  tolerating diet and ambulatory, voiding without difficulty, and it is  felt that she is ready to be discharged.   DISPOSITION:  Discharged home on regular diet.  No lifting.  No vigorous  activities.  No shower.  Empty his drains for at least 4 times a day and  record the amounts.  Discharged on Percocet 5 mg total of 40 given 1  p.o. q.4 p.r.n. for pain, Robaxin 5 mg 1 p.o. q.8 hours.  Recheck in the  office in a week and she will call while later this week to report  drainage amounts.      Etter Sjogren, M.D.  Electronically Signed     DB/MEDQ  D:  02/24/2009  T:  02/24/2009  Job:  [id number]

## 2011-03-03 NOTE — Op Note (Signed)
Shannon [name], Shannon [name] NO.:  [id number]   MEDICAL RECORD NO.:  [id number]          PATIENT TYPE:  AMB   LOCATION:  DSC                          FACILITY:  [facility]   PHYSICIAN:  Juanetta Gosling, MDDATE OF BIRTH:  [birth year]   DATE OF PROCEDURE:  09/19/2008  DATE OF DISCHARGE:                               OPERATIVE REPORT   PREOPERATIVE DIAGNOSES:  1. Breast cancer.  2. Need for venous access for chemotherapy.   POSTOPERATIVE DIAGNOSES:  1. Breast cancer.  2. Need for venous access for chemotherapy.   PROCEDURE:  Left subclavian Port-A-Cath insertion of Bard 9.6-French  PowerPort.   SURGEON:  Juanetta Gosling, MD   ASSISTANT:  None.   ANESTHESIA:  General.   SPECIMENS:  None.   DRAINS:  None.   FINDINGS:  Tip of line at atriocaval junction on the fluoroscopy.   ESTIMATED BLOOD LOSS:  Minimal.   COMPLICATIONS:  None.   DISPOSITION:  To PACU in stable condition.   INDICATIONS:  [name] is a 65 year old female who underwent a  mammogram that showed a 1.78 x 2-cm mass that was FNA'ed by another  surgeon, which showed finding of invasive ductal carcinoma.  She saw me  for evaluation, and we had long discussion over the treatment options  for breast cancer, and she chose to undergo mastectomy with sentinel  node along with a delayed TRAM flap by Dr. Odis Luster.  She underwent her  surgery for a T2 N0 tumor.  She did well postoperatively.  Drains were  removed.  She then saw Dr. Drue Second for evaluation for chemotherapy.  She has chosen to undergo chemotherapy.  I had attempted to put a port  in her previously, but she had a stitch abscess for which this was  cancelled.  She returns today doing well without any further evidence of  infection, plans to start chemotherapy with Dr. Welton Flakes tomorrow.   DESCRIPTION OF PROCEDURE:  After informed consent was obtained, the  patient was first administered 1 g of intravenous cefazolin.  She was  then  taken to the operating room.  Sequential compression devices were  placed on her lower extremities throughout the operation.  She was then  placed under general anesthesia without complication.  Her bilateral  chest and neck were prepped and draped in a standard sterile surgical  fashion.  A surgical time-out was then performed.   She was then placed in the Trendelenburg position.  Her left subclavian  vein was accessed on the first pass easily and the wire was passed.  This was confirmed by fluoroscopy to be in the correct position.  Following this, I made a 2-cm incision below this, where I then created  a pocket on her pectoralis fascia for the insertion of the port.  I used  a Bard 9.6-French pre-attached PowerPort, and this was sutured into  position with 2-0 Prolene in the pocket.  The catheter was then passed  through the hole where the wire was placed.  The dilator was then passed  using fluoroscopy under direct vision without complication.  The wire  assembly  was then removed.  The line was then passed with DeBakey  forceps into the peel-away sheath, which was then removed.  Following  this, the x-ray was taken, and the tip appeared to be at the atriocaval  junction.  The dermis of the port incision was then closed with a 3-0  Vicryl.  The skin was closed with a 4-0 Monocryl.  The port was accessed  and flushed with heparin saline.  It aspirated easily and also flushed  easily as well.  Due to the fact that she was getting her chemotherapy  tomorrow, after I closed both incisions with 4-0 Monocryl in a  subcuticular fashion, I accessed her with the [facility] needle and then left  this in position.  I again flushed this assembly with the tubing with  the heparin solution.  This also aspirated blood easily and flushed  easily.  Dermabond was placed over the wound.  A sterile dressing was  then placed over all this.  She tolerated this well and was transferred  to the recovery room in  stable condition.      Juanetta Gosling, MD  Electronically Signed     MCW/MEDQ  D:  09/19/2008  T:  09/19/2008  Job:  [id number]   cc:   Drue Second, MD  Etter Sjogren, M.D.

## 2011-03-03 NOTE — Op Note (Signed)
[name], [name] NO.:  [id number]   MEDICAL RECORD NO.:  [id number]          PATIENT TYPE:  OIB   LOCATION:  3310                         FACILITY:  [facility]   PHYSICIAN:  Juanetta Gosling, MDDATE OF BIRTH:  10-30-1945   DATE OF PROCEDURE:  07/27/2008  DATE OF DISCHARGE:                               OPERATIVE REPORT   Combination case with Dr. Odis Luster who [name] dictate his part.   PREOPERATIVE DIAGNOSIS:  Right breast cancer.   POSTOPERATIVE DIAGNOSIS:  Right breast cancer.   PROCEDURE:  1. Right simple mastectomy.  2. Right axillary sentinel node biopsy.   SURGEON:  Juanetta Gosling, MD   ASSISTANT:  Cherylynn Ridges, MD   ANESTHESIA:  General.   SPECIMENS:  Right breast and three sentinel nodes to Pathology.   ESTIMATED BLOOD LOSS:  Minimal.   COMPLICATIONS:  None.   DRAINS:  Two Blake drains to chest wall.   DISPOSITION:  To PACU in stable condition.   INDICATIONS:  Shannon [name] is a 65 year old female, who noted a right  breast mass, underwent an ultrasound which showed a 1.78 x 2-cm mass  that underwent FNA showing adenocarcinoma.  She came to see me, we  ordered an MRI that showed only this mass and no other abnormalities.  She was evaluated by Dr. Odis Luster for reconstruction.  She and I had a  long talk about breast conservation versus mastectomy and she has chosen  to undergo right simple mastectomy with sentinel lymph node biopsy as  well as a delayed TRAM flap by Dr. Odis Luster.   PROCEDURE:  After informed consent was obtained, the patient was first  administered 5000 units of subcutaneous heparin.  She was also  administered 1 g of Ancef.  She was then taken to the operating room,  sequential compression devices were placed in her lower extremities.  Her right breast, left breast, and abdomen then prepped and draped in a  standard sterile surgical fashion.  A surgical time-out was then  performed.  An elliptical incision was then made  to include her nipple-  areolar complex oriented so it was overlying her tumor.  Blue dye was  infiltrated into the subcutaneous tissue surrounding the areala.  Dissection was carried out down to the level of breast tissue, flaps  were then developed superiorly to the clavicle, medially to the sternum,  inferiorly just below the inframammary crease and laterally to the  axilla.  The breast tissue was then removed in total from the pectoralis  muscle to include the fascia.  The breast was then transected and passed  off table as specimen.  This was marked with a long stitch lateral  double stitch deep and a single short stitch superior and sent to  Pathology.  While removing the breast, there was a blue sentinel lymph  node that was not hot.  Using cautery, that was removed and sent to  Pathology.  The NeoProbe was then placed in the axilla.  There were two  further nodes that were noted to be hot with counts size 3500, one of  these nodes was  hot as well as blue from the dye that had been injected  prior to the operation.  One of the other nodes was hot, but not blue.  These three nodes went to Pathology, all of these were negative on  frozen section.  Hemostasis was observed.  Irrigation was performed.  I  then inserted two 19-French Blake drains into underneath her mastectomy  flaps.  All flaps were viable at the completion of this operation.  I  then closed the dermis with a 3-0 Vicryl and the skin with a 4-0  Monocryl in a subcuticular fashion.  Dermabond was then placed over  this.  She tolerated this procedure well.  She was then extubated in the  operating room and transferred to PACU in stable condition.      Juanetta Gosling, MD  Electronically Signed     MCW/MEDQ  D:  07/27/2008  T:  07/28/2008  Job:  [id number]

## 2011-03-03 NOTE — Discharge Summary (Signed)
[name], [name] NO.:  [id number]   MEDICAL RECORD NO.:  [id number]          PATIENT TYPE:  OIB   LOCATION:  5149                         FACILITY:  [facility]   PHYSICIAN:  Juanetta Gosling, MDDATE OF BIRTH:  12/23/1945   DATE OF ADMISSION:  07/27/2008  DATE OF DISCHARGE:  07/30/2008                               DISCHARGE SUMMARY   ADMISSION DIAGNOSIS:  Right breast cancer.   DISCHARGE DIAGNOSES:  Right breast cancer and postoperative decreased  oxygen saturations.   PROCEDURES PERFORMED:  1. Right simple mastectomy, right sentinel lymph node biopsy.  2. Delayed transverse rectus abdominis myocutaneous flap by Dr.      Odis Luster.   HISTORY:  Mrs. Asay is a 65 year old female who was found to have a  right breast cancer.  I evaluated her and she chose to undergo a right  simple mastectomy with sentinel lymph node biopsy with delayed TRAM by  Dr. Odis Luster.  She was taken to the operating room on July 27, 2008, and  underwent a right simple mastectomy with sentinel lymph node biopsy for  which she did well.  Although postoperatively in the recovery room, she  began developing some shallow breathing, bilateral rales and the chest x-  ray consistent with bilateral pulmonary infiltrates consistent with  pulmonary edema and decreased oxygen saturations.  She underwent  evaluation with EKG and cardiac enzymes, which were negative.  She was  monitored on this without any evidence of any injury to her myocardium.  Her pulmonary edema resolved on its own.  She remained in the hospital  until she was off oxygen.  Upon discharge, she was off oxygen,  ambulating, and tolerating a regular diet, and her drains were putting  out the appropriate material.  She was due to follow up with me in 1  week after discharge and was discharged on Vicodin for pain control.  She also had a followup planned with Dr. Odis Luster.      Juanetta Gosling, MD  Electronically  Signed     MCW/MEDQ  D:  09/03/2008  T:  09/04/2008  Job:  [id number]

## 2011-03-03 NOTE — Op Note (Signed)
NAMEKYAIRA, TRANTHAM NO.:  [id number]   MEDICAL RECORD NO.:  [id number]          PATIENT TYPE:  INP   LOCATION:  5150                         FACILITY:  [facility]   PHYSICIAN:  Etter Sjogren, M.D.     DATE OF BIRTH:  1946-09-28   DATE OF PROCEDURE:  02/21/2009  DATE OF DISCHARGE:                               OPERATIVE REPORT   PREOPERATIVE DIAGNOSES:  1. Right breast cancer.  2. Left breast ptosis.   POSTOPERATIVE DIAGNOSES:  1. Right breast cancer.  2. Left breast ptosis.  3. Complicated abdominal wound anterior wall   PROCEDURE PERFORMED:  1. Right breast reconstruction with a transverse rectus abdominis      myocutaneous flap, ipsilateral based.  2. Placement of mesh for abdominal wall closure.  3. Placement of On-Q catheters.  4. Left mastopexy for symmetry due to left breast ptosis.   SURGEON:  Etter Sjogren, MD   ASSISTANT:  Pleas Patricia, MD   ANESTHESIA:  General.   ESTIMATED BLOOD LOSS:  250 mL.   DRAINS:  Three Blake's were left, 1 in right chest, 2 in abdomen.   CLINICAL NOTE:  A 62-year woman who has had breast cancer on the right  side, had mastectomy and had delay of her TRAM flap at that time.  This  was back in October 2009.  She had done well from that, completed  chemotherapy and has been off of that for sometime now and is ready for  the next stage that includes the TRAM reconstruction on the right and  she needs a mastopexy on the left because of ptosis, she desired  symmetry.  The procedure and risks and possible complications discussed  with her at length.  Risks include but not limited to bleeding,  infection, anesthesia complications, healing problems, scarring, loss of  sensation, fluid collections, loss of tissue including the flap, loss of  nipple on the left, loss of sensation in the nipple, damaged  intraabdominal contents, pneumonia, pulmonary embolism, and asymmetry as  well as overall disappointment, abdominal  weakness, abdominal wall  hernia, and she understood all these and wished to proceed.   DESCRIPTION OF PROCEDURE:  The patient was marked in a full standing  position in the holding area for the mastopexy as well as for the TRAM  reconstruction on the right side.  She was then taken to the operating  room, placed supine.  After successful induction of general anesthesia,  she was prepped with Betadine and draped with sterile drapes.  The old  mastectomy scar was excised and the mastectomy defect was recreated with  irrigation with saline and meticulous hemostasis with electrocautery.  The subcutaneous tunnel was begun in the inferomedial portion of the  mastectomy defect and then a moist lap was positioned.  Attention was  then directed to the abdomen.   Incision made around the umbilicus and generous fatty stalk left from  the umbilicus especially on the left side, which would be the nonflap  side.  The preliminary incision was then made, dissection carried down  to the subcutaneous tissue using the electrocautery,  Bovie in  a  cephalad direction in order to capture perforators on the right side.  Care was taken to avoid damage to the underlying fascia and the  dissection continued cephalad and the subcutaneous tunnel connected  through to the mastectomy defect.  The lower limb of the incision was  then made, the site where the incision been made for the delay of the  flap.  The flap was then lifted from the left side over to the midline  and from the right side over to the lateral row perforators.  Parallel  incision was then made in the fascia on the right side anterior to the  rectus leaving a 3-cm wire strip of the fascia on the underlying muscle  and the medial and lateral edges of the muscle identified by dissecting  very gently with a Bipolar cautery.  The muscle was gently dissected  free from its surrounding fascial attachments using the Bipolar and  occasionally a scalpel and  the area of the tendinous inscriptions.  Having mobilizing it almost completely, lateral intercostals were  identified and double ligated and divided.  The attention was directed  to the chest with thorough irrigation with saline and meticulous  hemostasis with electrocautery was performed and a Blake drain was  positioned, brought through a separate stab wound inferomedially and  secured with a 3-0 Prolene suture.  Back to the abdomen, the muscle was  divided, the flap had excellent color, zone 4 was amputated, bright red  bleeding was noted, flap appeared very healthy.  Flap was passed gently  through the subcutaneous tunnel and out on to the chest.  Care was taken  to make sure that there was no tension on the muscle and at the muscle  pedicle was without any impingement from the overlying fascia.  The flap  was temporarily secured with skin staples within the mastectomy defect  in order to maintain its warmth and attention directed back to the  abdomen.   The abdomen was irrigated thoroughly with saline.  Meticulous hemostasis  with the electrocautery.  The muscle end was checked where the muscle  had been divided and again hemostasis there as well.  The fascia closure  with 0 Prolene interrupted figure-of-eight was taking great care to  avoid damage to the environment of abdominal contents.  Onlay mesh was  then placed to complete the abdominal wall closure and this was secured  around the periphery with 2-0 Prolene simple running suture taking care  to bring the umbilicus stub through this mesh without any tethering of  the mesh against stalk of the umbilicus.  The Blake drain was position,  brought through the separate stabs wounds inferiorly and secured with 3-  0 Prolene sutures.  Then, the On-Q catheters were prepared, this was  0.25% Marcaine.  The catheters were primed for the placement and the  catheters passed through subcutaneous tissues and secured using Steri  tapes and  Tegaderm and the catheters were carefully positioned for  maximum benefit, one of the tip of the catheter was even placed  underneath the Prolene suture around the Marlex mesh in order to hold  the catheter along with Marlex mesh.  After beginning assuring excellent  hemostasis, the abdominal closure with the patient in semi-Fowler  position.  This closure with 2-0 PDS interrupted inverted deep sutures  to the Scarpa layer, 2-0 PDS and Monocryl for the deep dermal closure  and 3-0 Monocryl running subcuticular suture.   An incision was then made at  the midline that would be marked  preoperatively and the umbilicus was brought through this opening.  Umbilicus was found to have excellent color and it is viable.  The skin  edge of the distal abdominal flap also was found to have excellent color  noted without any vascular compromise.  Umbilicus was infiltrated with 3-  0 Monocryl interrupted inverted deep dermal sutures.  The abdominal wall  closure completed with 3-0 Monocryl running subcuticular suture.   Attention was then directed back to the chest where the flap was  carefully marked, skin pattern and the deepithelialized and insetting  with 3-0 Monocryl interrupted inverted deep dermal sutures and running 4-  0 Monocryl subcuticular suture.   Attention directed to the left breast, a 38-mm marker marked the new  nipple-areolar complex.  An incision was made and the area within the  marking was deepithelialized.  The superior flaps were then elevated  with electrocautery.  Nipple color was excellent, bright red bleeding  around the periphery consistent with viability after conclusion of this  dissection.  The excellent hemostasis confirmed and achieved using  electrocautery after irrigating with saline.  The closure with 2-0  Vicryl interrupted inverted deep suture at the inverted T position  followed by 3-0 Monocryl interrupted inverted deep dermal sutures and 3-  0 and 4-0 running  subcuticular suture.  The measurement was then taken,  5 cm opened from the inframammary crease and a 38-mm maker marked in the  nipple-areolar site.  The tissue was resected, again, irrigation with  saline.  Meticulous hemostasis with electrocautery and the nipple-  areolar complex brought through this opening, again inspected and found  to have bright red bleeding around its periphery and pink color  consistent with viability.  Nipple-areolar insetting with 3-0 Monocryl  interrupted inverted deep dermal sutures and a running 3-0 Monocryl  subcuticular suture.  Steri-Strips and light dry sterile dressings were  applied. The flap continued to have excellent color and appeared to be  viable.  She was then transported to the recovery room in stable having  tolerated procedure well.      Etter Sjogren, M.D.  Electronically Signed     DB/MEDQ  D:  02/21/2009  T:  02/22/2009  Job:  [id number]

## 2011-04-15 ENCOUNTER — Other Ambulatory Visit: Payer: Self-pay | Admitting: *Deleted

## 2011-04-15 MED ORDER — LEVOTHYROXINE SODIUM 125 MCG PO TABS: 125.0000 ug | ORAL_TABLET | ORAL | Status: DC

## 2011-04-20 ENCOUNTER — Other Ambulatory Visit: Payer: Self-pay | Admitting: *Deleted

## 2011-04-20 MED ORDER — LEVOTHYROXINE SODIUM 112 MCG PO TABS: 112.0000 ug | ORAL_TABLET | ORAL | Status: DC

## 2011-06-03 ENCOUNTER — Other Ambulatory Visit: Payer: Self-pay | Admitting: *Deleted

## 2011-06-03 MED ORDER — LEVOTHYROXINE SODIUM 125 MCG PO TABS: 125.0000 ug | ORAL_TABLET | ORAL | Status: DC

## 2011-06-03 MED ORDER — SERTRALINE HCL 50 MG PO TABS: 50.0000 mg | ORAL_TABLET | Freq: Every day | ORAL | Status: DC

## 2011-06-17 ENCOUNTER — Other Ambulatory Visit: Payer: Self-pay | Admitting: Oncology

## 2011-06-17 ENCOUNTER — Encounter (HOSPITAL_BASED_OUTPATIENT_CLINIC_OR_DEPARTMENT_OTHER): Payer: BC Managed Care – PPO | Admitting: Oncology

## 2011-06-17 DIAGNOSIS — C50419 Malignant neoplasm of upper-outer quadrant of unspecified female breast: Secondary | ICD-10-CM

## 2011-06-17 DIAGNOSIS — R42 Dizziness and giddiness: Secondary | ICD-10-CM

## 2011-06-17 DIAGNOSIS — C50919 Malignant neoplasm of unspecified site of unspecified female breast: Secondary | ICD-10-CM

## 2011-06-17 DIAGNOSIS — M949 Disorder of cartilage, unspecified: Secondary | ICD-10-CM

## 2011-06-17 DIAGNOSIS — N644 Mastodynia: Secondary | ICD-10-CM

## 2011-06-17 LAB — CBC WITH DIFFERENTIAL/PLATELET
BASO%: 0.4 % (ref 0.0–2.0)
Basophils Absolute: 0 10*3/uL (ref 0.0–0.1)
Eosinophils Absolute: 0.2 10*3/uL (ref 0.0–0.5)
HCT: 38.5 % (ref 34.8–46.6)
HGB: 13.3 g/dL (ref 11.6–15.9)
LYMPH%: 33.8 % (ref 14.0–49.7)
MCHC: 34.6 g/dL (ref 31.5–36.0)
MONO#: 0.5 10*3/uL (ref 0.1–0.9)
NEUT#: 3.7 10*3/uL (ref 1.5–6.5)
NEUT%: 55.2 % (ref 38.4–76.8)
Platelets: 263 10*3/uL (ref 145–400)
WBC: 6.7 10*3/uL (ref 3.9–10.3)
lymph#: 2.3 10*3/uL (ref 0.9–3.3)

## 2011-06-17 LAB — COMPREHENSIVE METABOLIC PANEL
ALT: 14 U/L (ref 0–35)
BUN: 17 mg/dL (ref 6–23)
CO2: 26 mEq/L (ref 19–32)
Calcium: 9.4 mg/dL (ref 8.4–10.5)
Chloride: 102 mEq/L (ref 96–112)
Creatinine, Ser: 0.75 mg/dL (ref 0.50–1.10)
Glucose, Bld: 114 mg/dL — ABNORMAL HIGH (ref 70–99)
Total Bilirubin: 0.5 mg/dL (ref 0.3–1.2)

## 2011-06-17 LAB — VITAMIN D 25 HYDROXY (VIT D DEFICIENCY, FRACTURES): Vit D, 25-Hydroxy: 47 ng/mL (ref 30–89)

## 2011-06-30 ENCOUNTER — Other Ambulatory Visit: Payer: Self-pay | Admitting: *Deleted

## 2011-06-30 MED ORDER — MELOXICAM 15 MG PO TABS: 15.0000 mg | ORAL_TABLET | Freq: Every day | ORAL | Status: DC

## 2011-07-21 LAB — COMPREHENSIVE METABOLIC PANEL WITH GFR
ALT: 26
AST: 25
Albumin: 3.9
Alkaline Phosphatase: 65
BUN: 19
CO2: 29
Calcium: 9.9
Chloride: 102
Creatinine, Ser: 0.94
GFR calc non Af Amer: >60
Glucose, Bld: 95
Potassium: 4.2
Sodium: 139
Total Bilirubin: 0.8
Total Protein: 8.1

## 2011-07-21 LAB — DIFFERENTIAL
Basophils Absolute: 0
Basophils Absolute: 0
Basophils Relative: 0
Basophils Relative: 0
Basophils Relative: 0
Eosinophils Absolute: 0.1
Eosinophils Absolute: 0.2
Eosinophils Absolute: 0.3
Eosinophils Relative: 1
Eosinophils Relative: 2
Lymphocytes Relative: 38
Lymphocytes Relative: 40
Lymphs Abs: 3.7
Lymphs Abs: 2.5
Lymphs Abs: 2.9
Monocytes Absolute: 0.7
Monocytes Absolute: 0.4
Monocytes Absolute: 0.5
Monocytes Relative: 7
Monocytes Relative: 6
Monocytes Relative: 7
Neutro Abs: 5.3
Neutro Abs: 3.6
Neutro Abs: 3.9
Neutrophils Relative %: 54
Neutrophils Relative %: 51

## 2011-07-21 LAB — CBC
HCT: 30.8 — ABNORMAL LOW
HCT: 38.2
HCT: 37.1
HCT: 37.2
Hemoglobin: 10.6 — ABNORMAL LOW
Hemoglobin: 13.2
Hemoglobin: 12.5
Hemoglobin: 12.6
MCHC: 34.4
MCHC: 33.8
MCV: 99.1
MCV: 99.4
Platelets: 230
Platelets: 232
RBC: 3.11 — ABNORMAL LOW
RBC: 3.9
RBC: 3.73 — ABNORMAL LOW
RDW: 12.6
RDW: 12.7
WBC: 7.2
WBC: 7.1
WBC: 7.2

## 2011-07-21 LAB — BASIC METABOLIC PANEL WITH GFR
BUN: 12
CO2: 27
Calcium: 9.6
Chloride: 107
Creatinine, Ser: 0.73
GFR calc non Af Amer: >60
Glucose, Bld: 124 — ABNORMAL HIGH
Potassium: 3.7
Sodium: 141

## 2011-07-21 LAB — URINALYSIS, ROUTINE W REFLEX MICROSCOPIC
Glucose, UA: NEGATIVE
Ketones, ur: NEGATIVE
Nitrite: NEGATIVE
Protein, ur: NEGATIVE
Urobilinogen, UA: 0.2

## 2011-07-21 LAB — CARDIAC PANEL(CRET KIN+CKTOT+MB+TROPI)
CK, MB: 1.4
Relative Index: 0.3
Total CK: 420 — ABNORMAL HIGH

## 2011-07-21 LAB — CANCER ANTIGEN 27.29: CA 27.29: 39

## 2011-07-21 LAB — BASIC METABOLIC PANEL
BUN: 9
Creatinine, Ser: 0.64
GFR calc non Af Amer: >60
Potassium: 3.8

## 2011-07-21 LAB — GLUCOSE, CAPILLARY: Glucose-Capillary: 91

## 2011-07-21 LAB — APTT
aPTT: 30
aPTT: 32

## 2011-07-21 LAB — LACTATE DEHYDROGENASE: LDH: 167

## 2011-07-21 LAB — TROPONIN I: Troponin I: 0.02

## 2011-07-22 ENCOUNTER — Other Ambulatory Visit (INDEPENDENT_AMBULATORY_CARE_PROVIDER_SITE_OTHER): Payer: Self-pay | Admitting: General Surgery

## 2011-07-28 ENCOUNTER — Telehealth (INDEPENDENT_AMBULATORY_CARE_PROVIDER_SITE_OTHER): Payer: Self-pay

## 2011-07-28 DIAGNOSIS — C50911 Malignant neoplasm of unspecified site of right female breast: Secondary | ICD-10-CM

## 2011-07-28 NOTE — Telephone Encounter (Signed)
Pt left message requesting for [place] to put a diagnostic mgm order in [facility]. I notified her that I would take care of that today./ [facility]

## 2011-08-04 ENCOUNTER — Ambulatory Visit (INDEPENDENT_AMBULATORY_CARE_PROVIDER_SITE_OTHER): Payer: BC Managed Care – PPO | Admitting: Family Medicine

## 2011-08-04 ENCOUNTER — Encounter: Payer: Self-pay | Admitting: Family Medicine

## 2011-08-04 VITALS — BP 130/80 | HR 81 | Temp 98.3°F | Ht 63.0 in | Wt 178.0 lb

## 2011-08-04 DIAGNOSIS — M549 Dorsalgia, unspecified: Secondary | ICD-10-CM

## 2011-08-04 MED ORDER — HYDROCODONE-ACETAMINOPHEN 5-500 MG PO TABS: 1.0000 | ORAL_TABLET | Freq: Four times a day (QID) | ORAL | Status: AC | PRN

## 2011-08-04 MED ORDER — TRAMADOL HCL 50 MG PO TABS: 50.0000 mg | ORAL_TABLET | Freq: Four times a day (QID) | ORAL | Status: AC | PRN

## 2011-08-04 MED ORDER — TIZANIDINE HCL 4 MG PO TABS: 4.0000 mg | ORAL_TABLET | Freq: Every evening | ORAL | Status: AC

## 2011-08-04 NOTE — Progress Notes (Signed)
  Subjective:    Patient ID: Shannon [name], female    DOB: 1946/04/23, 64 y.o.   MRN: [id number]  HPI  Shannon [name], a 65 y.o. female presents today in the office for the following:    L sided back pain:  Rolled over in bed, L sided -- sharp and shooting pain an muscle pain on the L side. Left buttock. Sitting, laying standing. Started 1 week ago.  Now the patient has relatively significant pain in the L LB and L buttocks region with constant radiculopathy. An MR of the lumbar spine from 2010 and was reviewed and the patient does have a few small herniations as well as a spondyloarthropathy. 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 Was on celebrex for a long time.  Having more flrare ups off of celebrex. Mobic -- less pain in chest and breast bone and hands.   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  No bowel or bladder incontinence.    The PMH, PSH, Social History, Family History, Medications, and allergies have been reviewed in [facility], and have been updated if relevant.   Review of Systems Multiple c/o, multiple organ systems. Arthralgias, myalgias, anterior chest wall pain, breast pain, neuropathic sensations diffusely. She also has a breast lump and an upcoming mammogram.    Objective:   Physical Exam   Physical Exam  Blood pressure 130/80, pulse 81, temperature 98.3 F (36.8 C), temperature source Oral, height 5\' 3"  (1.6 m), weight 178 lb (80.74 kg), SpO2 98.00%.  Gen: Well-developed,well-nourished,in no acute distress; alert,appropriate and cooperative throughout examination HEENT: Normocephalic and atraumatic without obvious abnormalities.  Ears, externally no deformities Pulm: Breathing comfortably in no respiratory distress Range of motion at  the waist: Flexion: mild pain, able to flex approx 35 deg Extension: mild pain Rotation: mild pain, 40% decreased  No echymosis or edema Rises to examination table with mild  difficulty Gait: minimally antalgic  Inspection/Deformity: N Paraspinus T: Y  B Ankle Dorsiflexion (L5,4): 5/5 B Great Toe Dorsiflexion (L5,4): 5/5 Heel Walk (L5): WNL Toe Walk (S1): WNL Rise/Squat (L4): WNL, mild pain  SENSORY B Medial Foot (L4): WNL B Dorsum (L5): WNL B Lateral (S1): WNL Light Touch: WNL Pinprick: WNL  REFLEXES Knee (L4): 2+ Ankle (S1): 2+  B SLR, seated: neg B SLR, supine: pos B FABER: pain B Reverse FABER: pain B Greater Troch: NT B Log Roll: neg B Stork: NT B Sciatic Notch: NT       Assessment & Plan:   1. Back pain  tiZANidine (ZANAFLEX) 4 MG tablet, traMADol (ULTRAM) 50 MG tablet, HYDROcodone-acetaminophen (VICODIN) 5-500 MG per tablet    Acute back pain. Suspect left-sided nerve impingement. Await upcoming mammogram. For now use pain medications and muscle relaxants as well as moist heat. [facility].  If her mammogram is normal, and a pulse doses of steroids would likely be of benefit.

## 2011-08-04 NOTE — Patient Instructions (Signed)
Recheck 3 weeks

## 2011-08-07 ENCOUNTER — Ambulatory Visit
Admission: RE | Admit: 2011-08-07 | Discharge: 2011-08-07 | Disposition: A | Discharge status: Home / Self Care | Payer: BC Managed Care – PPO | Source: Ambulatory Visit | Attending: General Surgery | Admitting: General Surgery

## 2011-08-07 ENCOUNTER — Other Ambulatory Visit (INDEPENDENT_AMBULATORY_CARE_PROVIDER_SITE_OTHER): Payer: Self-pay | Admitting: General Surgery

## 2011-08-07 ENCOUNTER — Other Ambulatory Visit: Payer: Self-pay | Admitting: Family Medicine

## 2011-08-07 DIAGNOSIS — C50911 Malignant neoplasm of unspecified site of right female breast: Secondary | ICD-10-CM

## 2011-08-07 MED ORDER — PREDNISONE 20 MG PO TABS: ORAL_TABLET | ORAL | Status: DC

## 2011-08-25 ENCOUNTER — Ambulatory Visit (INDEPENDENT_AMBULATORY_CARE_PROVIDER_SITE_OTHER): Payer: BC Managed Care – PPO | Admitting: Family Medicine

## 2011-08-25 ENCOUNTER — Encounter: Payer: Self-pay | Admitting: Family Medicine

## 2011-08-25 DIAGNOSIS — M5416 Radiculopathy, lumbar region: Secondary | ICD-10-CM | POA: Insufficient documentation

## 2011-08-25 DIAGNOSIS — G609 Hereditary and idiopathic neuropathy, unspecified: Secondary | ICD-10-CM

## 2011-08-25 DIAGNOSIS — IMO0002 Reserved for concepts with insufficient information to code with codable children: Secondary | ICD-10-CM

## 2011-08-25 DIAGNOSIS — G629 Polyneuropathy, unspecified: Secondary | ICD-10-CM | POA: Insufficient documentation

## 2011-08-25 MED ORDER — GABAPENTIN 100 MG PO CAPS: 100.0000 mg | ORAL_CAPSULE | Freq: Three times a day (TID) | ORAL | Status: DC

## 2011-08-25 MED ORDER — MELOXICAM 15 MG PO TABS: 15.0000 mg | ORAL_TABLET | Freq: Every day | ORAL | Status: DC

## 2011-08-25 NOTE — Patient Instructions (Addendum)
Gabapentin titration: 1 tablet at night for 3 days, then 1 tablet twice a day for 3 days, then start 1 tablet three times a day  REFERRAL: GO THE THE FRONT ROOM AT THE ENTRANCE OF OUR CLINIC, NEAR CHECK IN. ASK FOR MARION. SHE WILL HELP YOU SET UP YOUR REFERRAL. DATE: TIME:   Recheck in 6 weeks

## 2011-08-25 NOTE — Progress Notes (Signed)
Subjective:    Patient ID: Shannon [name], female    DOB: 10/17/1946, 65 y.o.   MRN: [id number]  HPI  Shannon [name], a 65 y.o. female presents today in the office for the following:    F/u back pain. Burning, tingling -- weird feeling down. All the way down to her foot.  The patient turns with followup for low back pain that was acute several weeks ago. She's been on a prednisone Dosepak, and taken some intermittent anti-inflammatories and muscle relaxants. Her pain is completely gone at this point. She is having some paresthesias in decreased sensation in the left side of her leg and feet. After chemotherapy, the patient developed some peripheral neuropathy and paresthesias and decreased sensation on the right side, primarily in the feet.   At this point, she is primarily limited by her paresthesias in sensation in her feet.  Real decreased sensation.    08/04/2011 OV Multiple c/o, multiple organ systems. Arthralgias, myalgias, anterior chest wall pain, breast pain, neuropathic sensations diffusely.  L sided back pain:   Rolled over in bed, L sided -- sharp and shooting pain an muscle pain on the L side. Left buttock. Sitting, laying standing. Started 1 week ago.   Now the patient has relatively significant pain in the L LB and L buttocks region with constant radiculopathy. An MR of the lumbar spine from 2010 and was reviewed and the patient does have a few small herniations as well as a spondyloarthropathy. 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 Was on celebrex for a long time.   Having more flrare ups off of celebrex. Mobic -- less pain in chest and breast bone and hands.   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   No bowel or bladder incontinence.   The PMH, PSH, Social History, Family History, Medications, and allergies have been reviewed in [facility], and have been updated if relevant.   REVIEW OF SYSTEMS  GEN: No fevers, chills.  Nontoxic. Primarily MSK c/o today. MSK: Detailed in the HPI GI: tolerating PO intake without difficulty Neuro: detailed above Otherwise the pertinent positives of the ROS are noted above.    Gen: Well-developed,well-nourished,in no acute distress; alert,appropriate and cooperative throughout examination HEENT: Normocephalic and atraumatic without obvious abnormalities.  Ears, externally no deformities Pulm: Breathing comfortably in no respiratory distress Range of motion at  the waist: Flexion: mild pain, normal bending Extension: mild pain Rotation: mild pain, normal Much improved  No echymosis or edema Rises to examination table with no difficulty Gait: nonantalgic  Inspection/Deformity: N Paraspinus T: Y  B Ankle Dorsiflexion (L5,4): 5/5 B Great Toe Dorsiflexion (L5,4): 5/5 Heel Walk (L5): WNL Toe Walk (S1): WNL Rise/Squat (L4): WNL, mild pain  SENSORY B Medial Foot (L4): WNL B Dorsum (L5): decreased L > R B Lateral (S1): decreased L > R Light Touch: above Pinprick: above  B SLR, seated: neg B SLR, supine: pos B FABER: pain B Reverse FABER: pain B Greater Troch: NT B Log Roll: neg B Sciatic Notch: NT    1. Peripheral neuropathy  gabapentin (NEURONTIN) 100 MG capsule  2. 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 Certainly nervous system is involved and I recommended that the patient began neuropathic agents for neuropathic pain sensations. Also recommended formal spine physical therapy, and she has had none  previously.  We did discuss other potential options including further advanced imaging, operative procedures epidural steroid injections. At this time her primary focus is her neuropathic changes in her feet, and that is her limiting factor.  Orders Placed This Encounter    Procedures  . Ambulatory referral to Physical Therapy    Referral Priority:  Routine    Referral Type:  Physical Medicine    Referral Reason:  Specialty Services Required    Requested Specialty:  Physical Therapy    Number of Visits Requested:  1     The medications listed below were current for the patient prior to this office visit. Current Outpatient Prescriptions on File Prior to Visit  Medication Sig Dispense Refill  . aspirin 81 MG tablet Take 81 mg by mouth daily.        . cyclobenzaprine (FLEXERIL) 10 MG tablet Take 10 mg by mouth daily as needed.        . sertraline (ZOLOFT) 50 MG tablet Take 1 tablet (50 mg total) by mouth daily.  30 tablet  2  . SYNTHROID 100 MCG tablet Take 1 tablet by mouth daily.          Review of Systems     Objective:   Physical Exam        Assessment & Plan:

## 2011-10-06 ENCOUNTER — Encounter (INDEPENDENT_AMBULATORY_CARE_PROVIDER_SITE_OTHER): Payer: Self-pay | Admitting: General Surgery

## 2011-10-06 ENCOUNTER — Ambulatory Visit (INDEPENDENT_AMBULATORY_CARE_PROVIDER_SITE_OTHER): Payer: BC Managed Care – PPO | Admitting: General Surgery

## 2011-10-06 VITALS — BP 166/98 | HR 70 | Temp 97.6°F | Resp 18 | Ht 63.0 in | Wt 182.6 lb

## 2011-10-06 DIAGNOSIS — Z853 Personal history of malignant neoplasm of breast: Secondary | ICD-10-CM

## 2011-10-07 ENCOUNTER — Ambulatory Visit: Payer: BC Managed Care – PPO | Admitting: Family Medicine

## 2011-10-07 ENCOUNTER — Encounter (INDEPENDENT_AMBULATORY_CARE_PROVIDER_SITE_OTHER): Payer: Self-pay | Admitting: General Surgery

## 2011-10-07 NOTE — Patient Instructions (Signed)

## 2011-10-07 NOTE — Progress Notes (Signed)
Subjective:     Patient ID: Shannon [name], female   DOB: 08-02-1946, 65 y.o.   MRN: [id number]  HPI 15 yof with a history of stage II a right breast cancer treated with a right mastectomy, sentinel node biopsy, and a TRAM flap. She has also undergone implant placement as well as a mastopexy on the left side. She is at a number of medical issues over the last year. She has a mammogram and ultrasound completed in October of 2012 which is read as BI-RADS 1. She comes back in today as she has what she describes as a possible mass in the inner aspect of her left breast. She describes this area is very tender. She did have an abscess that appeared to be a stitch abscess in that area from one of her other surgeries. This area is not really changed over time and she came in today to discuss her concern over this region. She has no nipple discharge on that side. She also complains that she has some tenderness and pain in the left side of her abdomen but no bulge present.  She has no changes in her bowel movements with this.  Review of Systems DIGITAL DIAGNOSTIC BILATERAL MAMMOGRAM WITH CAD AND IMPLANT  DISPLACED VIEWS ON THE LEFT AND LEFT BREAST ULTRASOUND:  Comparison: 08/20/2010, 07/29/2010, 11/13/2009, 11/12/2008  Findings: Breast tissue is heterogeneously dense. On the left,  there is no evidence of mass, architectural distortion, or  calcifications. No change in the appearance of the implant.On the  right, there is no significant change in the appearance of a TRAM  flap. There are scattered benign dystrophic calcifications.  Mammographic images were processed with CAD.  On physical exam, there are no palpable abnormalities. The patient  is tender in the medial left breast.  Ultrasound is performed, showing no abnormalities in the medial  left breast.  IMPRESSION:  No suspicious findings.  BI-RADS CATEGORY 1: Negative.  RECOMMENDATION:  Diagnositic mammogram in one year.     Objective:   Physical Exam  Constitutional: She appears well-developed and well-nourished.  Neck: Neck supple.  Pulmonary/Chest: Right breast exhibits no inverted nipple (absent nipple s/p mastectomy), no mass, no skin change and no tenderness. Left breast exhibits tenderness. Left breast exhibits no inverted nipple, no mass, no nipple discharge and no skin change.    Abdominal: No hernia. Hernia confirmed negative in the ventral area.    Lymphadenopathy:    She has no cervical adenopathy.    She has no axillary adenopathy.       Right: No supraclavicular adenopathy present.       Left: No supraclavicular adenopathy present.       Assessment:     History of breast cancer, stage IIA right breast cancer    Plan:     I do not feel any clinical evidence of recurrence. Her ultrasound and mammogram are both negative. I think this area is normal breast tissue that is causing her some tenderness but there is not a discrete mass or anything that I think needs to be biopsied in this area. She is comfortable with that. She was given continue her self exams I will plan on seeing her back in 6 months unless something changes.  She also had a concern over an abdominal wall hernia. This is in the region where she had her TRAM flap. I cannot really identify a discrete hernia although there is a significant amount of scar tissue present. I told her that I do  not think there is a hernia but if this gets worse or she has more symptoms we could consider a CAT scan at some point.

## 2011-10-22 ENCOUNTER — Other Ambulatory Visit: Payer: Self-pay | Admitting: *Deleted

## 2011-10-22 MED ORDER — SERTRALINE HCL 50 MG PO TABS: 50.0000 mg | ORAL_TABLET | Freq: Every day | ORAL | Status: DC

## 2011-10-24 ENCOUNTER — Telehealth: Payer: Self-pay | Admitting: *Deleted

## 2011-10-24 NOTE — Telephone Encounter (Signed)
Pt was called, a message was left to notify pt of 01/14/2012 appts. ( lab@ 11:30, MD @ 1200

## 2012-01-05 ENCOUNTER — Telehealth: Payer: Self-pay | Admitting: *Deleted

## 2012-01-05 NOTE — Telephone Encounter (Signed)
patient called in requesting to change her appointment to 02-08-2012 starting at 8:30am patient confirmed over the phone the new date and time

## 2012-01-14 ENCOUNTER — Other Ambulatory Visit: Payer: BC Managed Care – PPO | Admitting: Lab

## 2012-01-14 ENCOUNTER — Ambulatory Visit: Payer: BC Managed Care – PPO | Admitting: Oncology

## 2012-02-08 ENCOUNTER — Ambulatory Visit (HOSPITAL_BASED_OUTPATIENT_CLINIC_OR_DEPARTMENT_OTHER): Payer: BC Managed Care – PPO | Admitting: Oncology

## 2012-02-08 ENCOUNTER — Telehealth: Payer: Self-pay | Admitting: *Deleted

## 2012-02-08 ENCOUNTER — Encounter: Payer: Self-pay | Admitting: Oncology

## 2012-02-08 ENCOUNTER — Other Ambulatory Visit (HOSPITAL_BASED_OUTPATIENT_CLINIC_OR_DEPARTMENT_OTHER): Payer: BC Managed Care – PPO | Admitting: Lab

## 2012-02-08 VITALS — BP 149/86 | HR 74 | Temp 98.2°F | Ht 63.0 in | Wt 184.0 lb

## 2012-02-08 DIAGNOSIS — M79609 Pain in unspecified limb: Secondary | ICD-10-CM

## 2012-02-08 DIAGNOSIS — R635 Abnormal weight gain: Secondary | ICD-10-CM

## 2012-02-08 DIAGNOSIS — C50919 Malignant neoplasm of unspecified site of unspecified female breast: Secondary | ICD-10-CM

## 2012-02-08 DIAGNOSIS — Z853 Personal history of malignant neoplasm of breast: Secondary | ICD-10-CM

## 2012-02-08 DIAGNOSIS — M949 Disorder of cartilage, unspecified: Secondary | ICD-10-CM

## 2012-02-08 LAB — CBC WITH DIFFERENTIAL/PLATELET
BASO%: 0.4 % (ref 0.0–2.0)
LYMPH%: 32.5 % (ref 14.0–49.7)
MCH: 32.9 pg (ref 25.1–34.0)
MCHC: 33.9 g/dL (ref 31.5–36.0)
MCV: 97.2 fL (ref 79.5–101.0)
MONO%: 7.5 % (ref 0.0–14.0)
Platelets: 261 10*3/uL (ref 145–400)
RBC: 3.91 10*6/uL (ref 3.70–5.45)
nRBC: 0 % (ref 0–0)

## 2012-02-08 LAB — COMPREHENSIVE METABOLIC PANEL
ALT: 11 U/L (ref 0–35)
AST: 16 U/L (ref 0–37)
BUN: 13 mg/dL (ref 6–23)
Creatinine, Ser: 0.71 mg/dL (ref 0.50–1.10)
Total Bilirubin: 0.5 mg/dL (ref 0.3–1.2)

## 2012-02-08 MED ORDER — ANASTROZOLE 1 MG PO TABS: 1.0000 mg | ORAL_TABLET | Freq: Every day | ORAL | Status: AC

## 2012-02-08 NOTE — Patient Instructions (Signed)
1. You are doing well.  2. Arimidex sent to your pharmacy  3. Try B-complex and folic acid for the neuropathy you may be feeling in your feet  4. Try to exercise more  5. I will see you back in 6 months with blood work

## 2012-02-08 NOTE — Progress Notes (Signed)
OFFICE PROGRESS NOTE  CC  Shannon Beat, MD, MD [address] [place] [address] Dr. Emelia [name] Dr. 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  PRIOR THERAPY:  #1 status post right mastectomy in October 2009.  #2 she then went on to receive adjuvant chemotherapy consisting of 4 cycles of Taxotere and Cytoxan administered from 09/20/2008 through 11/21/2008.  #3 she received Arimidex 1 mg daily starting 01/29/2009.  #4 status post TRAM reconstruction of the right breast on 02/21/2009.  CURRENT THERAPY:Arimidex 1 mg daily  INTERVAL HISTORY: Shannon [name] 66 y.o. female returns for Followup visit today. Her last visit with me was back in August 2012. Clinically from breast cancer perspective patient is doing well. However she has a lot of other things going on including neuropathic type pains chronic pulmonary disease arthritis. She is on multiple medications for this but without significant improvement in her symptoms. She also tells me today that her son and daughter have multiple sclerosis diagnosis as well. She is concerned about her weight gain today. She is not exercising as much as she should or would like to. She does state that she does have a lot of trouble with her feet and this is the primary reason for not exercising. She otherwise denies any nausea vomiting fevers chills night sweats headaches she has no chest pains or palpitations. Patient would like to have a breast reduction but tells me that they cannot do it. Remainder of the 10 point review of systems is negative.  MEDICAL HISTORY: Past Medical History  Diagnosis Date  . Stroke   . Thyroid disease     HYPO  . Restless leg syndrome   . Hx: UTI (urinary tract infection)   . Diverticulitis   . Anemia   . Reiter's disease     DIAGNOSED YEARS AGO IN [place] BY RHEUMATOLOGY  . HLD  (hyperlipidemia)   . GERD (gastroesophageal reflux disease)   . Neuromuscular disorder   . Hearing loss   . Nasal congestion   . Abdominal pain   . Cancer     stage IIA right breast    ALLERGIES:   has no known allergies.  MEDICATIONS:  Current Outpatient Prescriptions  Medication Sig Dispense Refill  . anastrozole (ARIMIDEX) 1 MG tablet Take 1 mg by mouth daily.      Marland Kitchen aspirin 81 MG tablet Take 81 mg by mouth daily.        . celecoxib (CELEBREX) 200 MG capsule Take 200 mg by mouth 2 (two) times daily.      Marland Kitchen co-enzyme Q-10 30 MG capsule Take 30 mg by mouth 3 (three) times daily.      Marland Kitchen gabapentin (NEURONTIN) 100 MG capsule Take 1 capsule (100 mg total) by mouth 3 (three) times daily.  90 capsule  5  . sertraline (ZOLOFT) 50 MG tablet Take 1 tablet (50 mg total) by mouth daily.  30 tablet  2  . SYNTHROID 100 MCG tablet Take 1 tablet by mouth daily.       . cyclobenzaprine (FLEXERIL) 10 MG tablet Take 10 mg by mouth daily as needed.        . meloxicam (MOBIC) 15 MG tablet Take 1 tablet (15 mg total) by mouth daily.  30 tablet  2    SURGICAL HISTORY:  Past Surgical History  Procedure Date  . Ovary surgery 2006  . Abdominal hysterectomy  1976  . Cholecystectomy 2008  . Hernia repair 2010  . Tram     right tram flap  . Breast surgery 2009    right mastectomy sent node biopsy  . Breast reconstruction     right tram, left implant and lift    REVIEW OF SYSTEMS:  Pertinent items are noted in HPI.   PHYSICAL EXAMINATION: General appearance: alert, cooperative and appears stated age Neck: no adenopathy, no carotid bruit, no JVD, supple, symmetrical, trachea midline and thyroid not enlarged, symmetric, no tenderness/mass/nodules Lymph nodes: Cervical, supraclavicular, and axillary nodes normal. Resp: clear to auscultation bilaterally and normal percussion bilaterally Back: symmetric, no curvature. ROM normal. No CVA tenderness. Cardio: regular rate and rhythm, S1, S2 normal, no  murmur, click, rub or gallop GI: soft, non-tender; bowel sounds normal; no masses,  no organomegaly Extremities: extremities normal, atraumatic, no cyanosis or edema Neurologic: Grossly normal Bilateral breast examination right constructed breast is without any skin changes well healed surgical scar. Left breast no masses nipple discharge no skin changes. ECOG PERFORMANCE STATUS: 1 - Symptomatic but completely ambulatory  Blood pressure 149/86, pulse 74, temperature 98.2 F (36.8 C), height 5\' 3"  (1.6 m), weight 184 lb (83.462 kg).  LABORATORY DATA: Lab Results  Component Value Date   WBC 6.4 02/08/2012   HGB 12.9 02/08/2012   HCT 38.0 02/08/2012   MCV 97.2 02/08/2012   PLT 261 02/08/2012       RADIOGRAPHIC STUDIES:  No results found.  ASSESSMENT: 66 year old female with  #1 stage II lobular carcinoma of the right breast status post mastectomy. She is status post adjuvant chemotherapy consisting of Taxotere and Cytoxan. She is now on Arimidex overall she is doing well she has no evidence of recurrent disease.  #2 weight gain  #3 bilateral foot pain   PLAN:   #1 patient will continue the Arimidex on a daily basis she will be seen back in 6 months time. A prescription for Arimidex was sent to her pharmacy.  #2 we discussed exercise.  #3 I recommended patient take some B. Complex vitamins as well as folic acid to see if that helps. I do think that if she loses some weight that may help her feet as well.  #4 she will continue to see her primary care physician for all of her other medical needs.   All questions were answered. The patient knows to call the clinic with any problems, questions or concerns. We can certainly see the patient much sooner if necessary.  I spent 30 minutes counseling the patient face to face. The total time spent in the appointment was 30 minutes.    Drue Second, MD Medical/Oncology [facility] [phone] (beeper) [phone]  (Office)  02/08/2012, 9:13 AM

## 2012-02-08 NOTE — Telephone Encounter (Signed)
per orders from 02-08-2012 gave patient appointment for 08-10-2012 starting at 8:30am with labs

## 2012-02-09 ENCOUNTER — Other Ambulatory Visit: Payer: Self-pay | Admitting: *Deleted

## 2012-02-09 MED ORDER — SERTRALINE HCL 50 MG PO TABS: 50.0000 mg | ORAL_TABLET | Freq: Every day | ORAL | Status: DC

## 2012-04-05 ENCOUNTER — Encounter (INDEPENDENT_AMBULATORY_CARE_PROVIDER_SITE_OTHER): Payer: BC Managed Care – PPO | Admitting: General Surgery

## 2012-04-14 ENCOUNTER — Telehealth: Payer: Self-pay | Admitting: Family Medicine

## 2012-04-14 NOTE — Telephone Encounter (Signed)
error 

## 2012-04-15 ENCOUNTER — Encounter: Payer: Self-pay | Admitting: Family Medicine

## 2012-04-15 ENCOUNTER — Ambulatory Visit (INDEPENDENT_AMBULATORY_CARE_PROVIDER_SITE_OTHER): Payer: Medicare Other | Admitting: Family Medicine

## 2012-04-15 VITALS — BP 140/88 | HR 64 | Temp 98.0°F | Wt 178.0 lb

## 2012-04-15 DIAGNOSIS — G629 Polyneuropathy, unspecified: Secondary | ICD-10-CM

## 2012-04-15 DIAGNOSIS — G609 Hereditary and idiopathic neuropathy, unspecified: Secondary | ICD-10-CM

## 2012-04-15 DIAGNOSIS — F329 Major depressive disorder, single episode, unspecified: Secondary | ICD-10-CM

## 2012-04-15 DIAGNOSIS — K148 Other diseases of tongue: Secondary | ICD-10-CM

## 2012-04-15 DIAGNOSIS — Z23 Encounter for immunization: Secondary | ICD-10-CM

## 2012-04-15 DIAGNOSIS — F32A Depression, unspecified: Secondary | ICD-10-CM | POA: Insufficient documentation

## 2012-04-15 MED ORDER — SERTRALINE HCL 50 MG PO TABS: 50.0000 mg | ORAL_TABLET | Freq: Every day | ORAL | Status: DC

## 2012-04-15 NOTE — Addendum Note (Signed)
Addended by: Eliezer Bottom on: 04/15/2012 04:09 PM   Modules accepted: Orders

## 2012-04-15 NOTE — Patient Instructions (Addendum)
It was great to see you. Please stop by to see Shirlee Limerick on your way out to set up your ENT referral.

## 2012-04-15 NOTE — Progress Notes (Signed)
Subjective:    Patient ID: Shannon [name], female    DOB: 04-26-1946, 66 y.o.   MRN: [id number]  HPI 66 yo pt here to establish care with me (Dr. Patsy Lager former PCP).  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  Has had persistent LE neuropathy (particularly in her feet) since chemotherapy.   Takes celebrex, mobic and neurontin.  Tongue lesion- has noticed a small lesion on right side of anterior tongue for over a month.  Not painful.  Feels it is growing in size.  She drinks ETOH only occasionally and is a former smoker (quit in 2005). Denies any difficulty or pain with swallowing but does endorse increased raspiness to her voice over the past month and a "funny feeling" /soreness deep inside her throat. She does not have a h/o GERD and denies any other reflux symptoms.  Depression- well controlled on Zoloft.  Would like it refilled.  She is also interested in getting her pneumovax- today is her 66th birthday.  Patient Active Problem List  Diagnosis  . HYPOTHYROIDISM  . HYPERLIPIDEMIA  . ANEMIA-NOS  . TRANSIENT ISCHEMIC ATTACK  . CERVICAL RADICULOPATHY  . VERTIGO  . PARESTHESIA  . BREAST CANCER, HX OF  . DIVERTICULITIS, HX OF  . REFLEX SYMPATHETIC DYSTROPHY  . Peripheral neuropathy  . Lumbar radiculopathy  . Tongue lesion   Past Medical History  Diagnosis Date  . Stroke   . Thyroid disease     HYPO  . Restless leg syndrome   . Hx: UTI (urinary tract infection)   . Diverticulitis   . Anemia   . Reiter's disease     DIAGNOSED YEARS AGO IN [place] BY RHEUMATOLOGY  . HLD (hyperlipidemia)   . GERD (gastroesophageal reflux disease)   . Neuromuscular disorder   . Hearing loss   . Nasal congestion   . Abdominal pain   . Cancer     stage IIA right breast   Past Surgical History  Procedure Date  . Ovary surgery 2006  . Abdominal hysterectomy 1976  . Cholecystectomy 2008  . Hernia  repair 2010  . Tram     right tram flap  . Breast surgery 2009    right mastectomy sent node biopsy  . Breast reconstruction     right tram, left implant and lift   History  Substance Use Topics  . Smoking status: Former Smoker    Quit date: 11/08/2003  . Smokeless tobacco: Never Used  . Alcohol Use: Yes     rarely   Family History  Problem Relation Age of Onset  . Cancer Mother     lung  . Cancer Maternal Grandmother     lung   No Known Allergies Current Outpatient Prescriptions on File Prior to Visit  Medication Sig Dispense Refill  . anastrozole (ARIMIDEX) 1 MG tablet Take 1 mg by mouth daily.      Marland Kitchen aspirin 81 MG tablet Take 81 mg by mouth daily.        . celecoxib (CELEBREX) 200 MG capsule Take 200 mg by mouth 2 (two) times daily.      Marland Kitchen co-enzyme Q-10 30 MG capsule Take 30 mg by mouth 3 (three) times daily.      . cyclobenzaprine (FLEXERIL) 10 MG tablet Take 10 mg by mouth daily as needed.        Marland Kitchen SYNTHROID 100 MCG tablet Take 1 tablet by mouth daily.       Marland Kitchen  DISCONTD: sertraline (ZOLOFT) 50 MG tablet Take 1 tablet (50 mg total) by mouth daily.  30 tablet  2   The PMH, PSH, Social History, Family History, Medications, and allergies have been reviewed in [facility], and have been updated if relevant.   Review of Systems    See HPI No n/v/d No CP No fevers No changes in appetite Objective:   Physical Exam BP 140/88  Pulse 64  Temp 98 F (36.7 C)  Wt 178 lb (80.74 kg)  General:  Well-developed,well-nourished,in no acute distress; alert,appropriate and cooperative throughout examination Head:  normocephalic and atraumatic.   Eyes:  vision grossly intact, pupils equal, pupils round, and pupils reactive to light.   Ears:  R ear normal and L ear normal.   Nose:  no external deformity.   Mouth:  Small, flesh colored, elevated lesion, right anterior front of tongue Neck:  No deformities, masses, or tenderness noted. Msk:  No deformity or scoliosis noted of thoracic  or lumbar spine.   Extremities:  No clubbing, cyanosis, edema, or deformity noted with normal full range of motion of all joints.   Neurologic:  alert & oriented X3 and gait normal.   Skin:  Intact without suspicious lesions or rashes Cervical Nodes:  No lymphadenopathy noted Psych:  Cognition and judgment appear intact. Alert and cooperative with normal attention span and concentration. No apparent delusions, illusions, hallucinations     Assessment & Plan:   1. Tongue lesion Given associated symptoms and duration of symptoms, will refer to ENT for further evaluation and possible biopsy of lesion. The patient indicates understanding of these issues and agrees with the plan.   Ambulatory referral to ENT  2. Peripheral neuropathy  Continue current medications.   3. Depression  Stable.  Zoloft refilled.

## 2012-04-20 ENCOUNTER — Encounter (INDEPENDENT_AMBULATORY_CARE_PROVIDER_SITE_OTHER): Payer: BC Managed Care – PPO | Admitting: General Surgery

## 2012-05-13 ENCOUNTER — Ambulatory Visit (INDEPENDENT_AMBULATORY_CARE_PROVIDER_SITE_OTHER): Payer: Medicare Other | Admitting: General Surgery

## 2012-05-13 ENCOUNTER — Other Ambulatory Visit (INDEPENDENT_AMBULATORY_CARE_PROVIDER_SITE_OTHER): Payer: Self-pay | Admitting: General Surgery

## 2012-05-13 ENCOUNTER — Encounter (INDEPENDENT_AMBULATORY_CARE_PROVIDER_SITE_OTHER): Payer: Self-pay | Admitting: General Surgery

## 2012-05-13 VITALS — BP 152/98 | HR 72 | Temp 97.6°F | Resp 14 | Ht 64.0 in | Wt 178.6 lb

## 2012-05-13 DIAGNOSIS — Z853 Personal history of malignant neoplasm of breast: Secondary | ICD-10-CM

## 2012-05-13 DIAGNOSIS — R1031 Right lower quadrant pain: Secondary | ICD-10-CM

## 2012-05-13 DIAGNOSIS — G8929 Other chronic pain: Secondary | ICD-10-CM

## 2012-05-13 NOTE — Patient Instructions (Signed)

## 2012-05-13 NOTE — Progress Notes (Signed)
Subjective:     Patient ID: Shannon [name], female   DOB: Aug 19, 1946, 66 y.o.   MRN: [id number]  HPI  66 yof with a history of stage II a right breast cancer treated with a right mastectomy, sentinel node biopsy, and a TRAM flap. She has also undergone implant placement as well as a mastopexy on the left side.  She has a mammogram and ultrasound completed in October of 2012 which is read as BI-RADS 1. She comes back in today again with what sh describes as a tender area in the inner aspect of her left breast. She describes this area is very tender. She did have an abscess that appeared to be a stitch abscess in that area from one of her other surgeries. This area has undergone u/s in October with nothing concerning. This area is not really changed over time and she came in today to discuss her concern over this region. She has no nipple discharge on that side.  She also complains that she has some tenderness and pain in the region of her tram scar with possible bulge also that is bothering her.  She also complaints of headaches, sob with exertion and foot pain.   Review of Systems DIGITAL DIAGNOSTIC BILATERAL MAMMOGRAM WITH CAD AND IMPLANT  DISPLACED VIEWS ON THE LEFT AND LEFT BREAST ULTRASOUND:  Comparison: 08/20/2010, 07/29/2010, 11/13/2009, 11/12/2008  Findings: Breast tissue is heterogeneously dense. On the left,  there is no evidence of mass, architectural distortion, or  calcifications. No change in the appearance of the implant.On the  right, there is no significant change in the appearance of a TRAM  flap. There are scattered benign dystrophic calcifications.  Mammographic images were processed with CAD.  On physical exam, there are no palpable abnormalities. The patient  is tender in the medial left breast.  Ultrasound is performed, showing no abnormalities in the medial  left breast.  IMPRESSION:  No suspicious findings.      Objective:   Physical Exam  Vitals  reviewed. Constitutional: She appears well-developed and well-nourished.  Pulmonary/Chest: Right breast exhibits no mass. Left breast exhibits tenderness (area of tenderness without discrete mass). Left breast exhibits no inverted nipple, no mass, no nipple discharge and no skin change.    Lymphadenopathy:    She has no cervical adenopathy.    She has no axillary adenopathy.       Right: No supraclavicular adenopathy present.       Left: No supraclavicular adenopathy present.       Assessment:     History stage II a right breast cancer Abdominal pain    Plan:     I  I don't think she has any clinical evidence of breast cancer recurrence. I think this area and the left side is likely surgical related. I cannot identify anything on my exam, [place] or on her mammography that would be reasonable to excise at this point  that is concerning for a breast cancer. I told that I think if we excised this area we distally followed by more pain quite honestly. We willcontinue our self exams, clinical exams every 6 months, and yearly mammograms. She also may go see Dr. Odis Luster again to see about a little bit of her reduction on the side of her TRAM flap. She has a multitude of other complaints today about his her general health. She is recently safe primary care physicians and is now seeing Dr. Dayton Martes. Her blood pressure is elevated today and she has some  significant shortness of breath. I recommended her that she give Dr. Elmer Sow office a call to get in to see her at some point in the near future. I am also somewhat concerned about her abdominal pain and will rule out hernia. I'm going to obtain a CT scan and I will call her after that. I will plan on following up in 6 months.

## 2012-05-14 LAB — BUN: BUN: 16 mg/dL (ref 6–23)

## 2012-05-20 ENCOUNTER — Telehealth (INDEPENDENT_AMBULATORY_CARE_PROVIDER_SITE_OTHER): Payer: Self-pay | Admitting: General Surgery

## 2012-05-20 NOTE — Telephone Encounter (Signed)
Message copied by Liliana Cline on Fri May 20, 2012  2:05 PM ------      Message from: Matilde Sprang      Created: Fri May 20, 2012 10:59 AM      Regarding: CT orders       Marisue Ivan at [facility] called to state that this pt can get what we need with only "with" contrast.  Doing with and without would be unnecessary exposure to radiation.  Her appt is Monday at 11:30, if you will change orders.  Thanks, bp

## 2012-05-20 NOTE — Addendum Note (Signed)
Addended byLiliana Cline on: 05/20/2012 02:05 PM   Modules accepted: Orders

## 2012-05-20 NOTE — Telephone Encounter (Signed)
Order corrected in EPIC 

## 2012-05-23 ENCOUNTER — Ambulatory Visit
Admission: RE | Admit: 2012-05-23 | Discharge: 2012-05-23 | Disposition: A | Discharge status: Home / Self Care | Payer: Medicare Other | Source: Ambulatory Visit | Attending: General Surgery | Admitting: General Surgery

## 2012-05-23 DIAGNOSIS — G8929 Other chronic pain: Secondary | ICD-10-CM

## 2012-05-23 MED ORDER — IOHEXOL 300 MG/ML  SOLN
100.0000 mL | Freq: Once | INTRAMUSCULAR | Status: AC | PRN
  Administered 2012-05-23: 100 mL via INTRAVENOUS

## 2012-05-26 ENCOUNTER — Telehealth (INDEPENDENT_AMBULATORY_CARE_PROVIDER_SITE_OTHER): Payer: Self-pay

## 2012-05-26 NOTE — Telephone Encounter (Signed)
LMOM at [facility] ,and cell to call me back to go over CT results.

## 2012-05-26 NOTE — Telephone Encounter (Signed)
Returned my call. I notified her that Dr Dwain Sarna did review her CT results which shows a poss. Small hernia but Dr Dwain Sarna is not convinced after looking at the films. Dr Dwain Sarna wants to review the films with Dr Odis Luster b/c he thinks this area might be part of her tram flap. I will call her next week after DR Dwain Sarna gets with Dr Odis Luster.

## 2012-06-08 ENCOUNTER — Telehealth (INDEPENDENT_AMBULATORY_CARE_PROVIDER_SITE_OTHER): Payer: Self-pay

## 2012-06-08 NOTE — Telephone Encounter (Signed)
Called pt back after Dr Dwain Sarna spoke with Dr Odis Luster about the CT scan. Dr Odis Luster doesn't feel that the area on CT has anything to do with the flap so Dr Dwain Sarna wants pt to come back to the office to be examined again for poss. Hernia. The pt is out of town till after 06/25/12. I made her appt for 07/04/12. The pt understands.

## 2012-06-30 ENCOUNTER — Ambulatory Visit (INDEPENDENT_AMBULATORY_CARE_PROVIDER_SITE_OTHER): Payer: Medicare Other | Admitting: Family Medicine

## 2012-06-30 ENCOUNTER — Ambulatory Visit (INDEPENDENT_AMBULATORY_CARE_PROVIDER_SITE_OTHER)
Admission: RE | Admit: 2012-06-30 | Discharge: 2012-06-30 | Disposition: A | Discharge status: Home / Self Care | Payer: Medicare Other | Source: Ambulatory Visit | Attending: Family Medicine | Admitting: Family Medicine

## 2012-06-30 ENCOUNTER — Encounter: Payer: Self-pay | Admitting: Family Medicine

## 2012-06-30 VITALS — BP 150/96 | HR 80 | Temp 98.2°F | Wt 183.0 lb

## 2012-06-30 DIAGNOSIS — R0989 Other specified symptoms and signs involving the circulatory and respiratory systems: Secondary | ICD-10-CM

## 2012-06-30 DIAGNOSIS — R0609 Other forms of dyspnea: Secondary | ICD-10-CM

## 2012-06-30 DIAGNOSIS — R030 Elevated blood-pressure reading, without diagnosis of hypertension: Secondary | ICD-10-CM

## 2012-06-30 DIAGNOSIS — I10 Essential (primary) hypertension: Secondary | ICD-10-CM

## 2012-06-30 DIAGNOSIS — R9431 Abnormal electrocardiogram [ECG] [EKG]: Secondary | ICD-10-CM

## 2012-06-30 LAB — COMPREHENSIVE METABOLIC PANEL
AST: 22 U/L (ref 0–37)
Albumin: 3.7 g/dL (ref 3.5–5.2)
BUN: 16 mg/dL (ref 6–23)
Calcium: 9.9 mg/dL (ref 8.4–10.5)
Chloride: 105 mEq/L (ref 96–112)
Creatinine, Ser: 0.7 mg/dL (ref 0.4–1.2)
GFR: 84.98 mL/min (ref >60.00)
Glucose, Bld: 106 mg/dL — ABNORMAL HIGH (ref 70–99)

## 2012-06-30 LAB — CBC WITH DIFFERENTIAL/PLATELET
Basophils Relative: 0.2 % (ref 0.0–3.0)
Eosinophils Absolute: 0.2 10*3/uL (ref 0.0–0.7)
Eosinophils Relative: 1.7 % (ref 0.0–5.0)
Hemoglobin: 13.4 g/dL (ref 12.0–15.0)
Lymphocytes Relative: 32.1 % (ref 12.0–46.0)
MCHC: 33.2 g/dL (ref 30.0–36.0)
MCV: 98.8 fl (ref 78.0–100.0)
Neutro Abs: 5 10*3/uL (ref 1.4–7.7)
RBC: 4.07 Mil/uL (ref 3.87–5.11)
WBC: 8.8 10*3/uL (ref 4.5–10.5)

## 2012-06-30 MED ORDER — HYDROCHLOROTHIAZIDE 12.5 MG PO CAPS: 12.5000 mg | ORAL_CAPSULE | Freq: Every day | ORAL | Status: DC

## 2012-06-30 NOTE — Progress Notes (Signed)
Subjective:    Patient ID: Shannon [name], female    DOB: 1946/05/05, 66 y.o.   MRN: [id number]  HPI  66 yo female previous pt of Dr. Patsy Lager with h/o breast CA, hypothyroidism, h/o TIA, anemia, here for for:  1. HTN- never been diagnosed with HTN. Brings in log from her other doctor's office and at home. BPs ranging over past year between 140/80- 170/95.  She has been having more headaches. No CP or worsening vision.  2.  DOE- past year feels her DOE is getting worse. Sometimes SOB at rest too.  Sees an endocrinologist, Dr. Burna Forts who manages her hypothyroidism and synthroid dose.  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  No cardiac history.    Patient Active Problem List  Diagnosis  . HYPOTHYROIDISM  . HYPERLIPIDEMIA  . ANEMIA-NOS  . TRANSIENT ISCHEMIC ATTACK  . CERVICAL RADICULOPATHY  . VERTIGO  . PARESTHESIA  . BREAST CANCER, HX OF  . DIVERTICULITIS, HX OF  . REFLEX SYMPATHETIC DYSTROPHY  . Peripheral neuropathy  . Lumbar radiculopathy  . Tongue lesion  . Depression  . Elevated BP  . Hypertension  . DOE (dyspnea on exertion)   Past Medical History  Diagnosis Date  . Stroke   . Thyroid disease     HYPO  . Restless leg syndrome   . Hx: UTI (urinary tract infection)   . Diverticulitis   . Anemia   . Reiter's disease     DIAGNOSED YEARS AGO IN [place] BY RHEUMATOLOGY  . HLD (hyperlipidemia)   . GERD (gastroesophageal reflux disease)   . Neuromuscular disorder   . Hearing loss   . Nasal congestion   . Abdominal pain   . Cancer     stage IIA right breast   Past Surgical History  Procedure Date  . Ovary surgery 2006  . Abdominal hysterectomy 1976  . Cholecystectomy 2008  . Hernia repair 2010  . Tram     right tram flap  . Breast surgery 2009    right mastectomy sent node  biopsy  . Breast reconstruction     right tram, left implant and lift   History  Substance Use Topics  . Smoking status: Former Smoker    Quit date: 11/08/2003  . Smokeless tobacco: Never Used  . Alcohol Use: Yes     rarely   Family History  Problem Relation Age of Onset  . Cancer Mother     lung  . Cancer Maternal Grandmother     lung   No Known Allergies Current Outpatient Prescriptions on File Prior to Visit  Medication Sig Dispense Refill  . anastrozole (ARIMIDEX) 1 MG tablet Take 1 mg by mouth daily.      Marland Kitchen aspirin 81 MG tablet Take 81 mg by mouth daily.        . celecoxib (CELEBREX) 200 MG capsule Take 200 mg by mouth 2 (two) times daily.      Marland Kitchen co-enzyme Q-10 30 MG capsule Take 30 mg by mouth 3 (three) times daily.      . cyclobenzaprine (FLEXERIL) 10 MG tablet Take 10 mg by mouth daily as needed.        Marland Kitchen levothyroxine (SYNTHROID, LEVOTHROID) 100 MCG tablet Take 100 mcg by mouth daily.      Marland Kitchen  sertraline (ZOLOFT) 50 MG tablet Take 1 tablet (50 mg total) by mouth daily.  30 tablet  11  . hydrochlorothiazide (MICROZIDE) 12.5 MG capsule Take 1 capsule (12.5 mg total) by mouth daily.  30 capsule  1   The PMH, PSH, Social History, Family History, Medications, and allergies have been reviewed in [facility], and have been updated if relevant.   Review of Systems    See HPI No changes in appetite Sleeping ok Objective:   Physical Exam BP 150/96  Pulse 80  Temp 98.2 F (36.8 C)  Wt 183 lb (83.008 kg) BP Readings from Last 3 Encounters:  06/30/12 150/96  05/13/12 152/98  04/15/12 140/88   GEN: Well-developed,well-nourished,in no acute distress; alert,appropriate and cooperative throughout examination  HEENT: Normocephalic and atraumatic without obvious abnormalities. Ears, externally no deformities  CV: RRR, no m/g/r  PULM: Breathing comfortably in no respiratory distress, CTA B  EXT: No clubbing, cyanosis, or edema  PSYCH: Normally interactive. Cooperative during the  interview. Pleasant. Friendly and conversant. Not anxious or depressed appearing. Normal, full affect.      Assessment & Plan:   1. Hypertension  New diagnosis- trends are consistent with HTN. Start HCTZ 12.5 mg daily. Pt to call in 65 week with her BP readings.   2. DOE (dyspnea on exertion)   Lungs clear on exam. EKG shows sinus rhythm with left fas block - none for comparison. Given duration and progression of symptoms, will order CXR, labs today. Also refer to cardiology for work up/stress test.The patient indicates understanding of these issues and agrees with the plan.  CBC with Differential, Comprehensive metabolic panel, EKG 12-Lead, DG Chest 2 View, Ambulatory referral to Cardiology

## 2012-06-30 NOTE — Patient Instructions (Addendum)
We are starting HCTZ (a diuretic).- please take a directed- 1 tablet by mouth daily. Please continue checking your blood pressure at home and call me in 1 week with the readings.  We are also checking some blood work today. We will call you with those results as soon as they are available.  Please stop by to see Shirlee Limerick on your way out to set up your cardiology referral.

## 2012-07-04 ENCOUNTER — Ambulatory Visit (INDEPENDENT_AMBULATORY_CARE_PROVIDER_SITE_OTHER): Payer: Medicare Other | Admitting: General Surgery

## 2012-07-04 ENCOUNTER — Encounter (INDEPENDENT_AMBULATORY_CARE_PROVIDER_SITE_OTHER): Payer: Self-pay | Admitting: General Surgery

## 2012-07-04 VITALS — BP 146/80 | HR 87 | Temp 97.8°F | Ht 63.0 in | Wt 181.0 lb

## 2012-07-04 DIAGNOSIS — Z853 Personal history of malignant neoplasm of breast: Secondary | ICD-10-CM

## 2012-07-04 NOTE — Progress Notes (Signed)
Subjective:     Patient ID: Shannon [name], female   DOB: [birth year], 66 y.o.   MRN: [id number]  HPI 47 yof with a history of stage II a right breast cancer treated with a right mastectomy, sentinel node biopsy, and a TRAM flap. She has also undergone implant placement as well as a mastopexy on the left side. She has a mammogram and ultrasound completed in October of 2012 which is read as BI-RADS 1.  She complained that she has some tenderness and pain in the region of her tram scar with possible bulge also that is bothering her. I sent her for a ct scan that is shown below.She also complaints of headaches, sob with exertion and foot pain. She has recently started anti-hypertensive.   Review of Systems CT ABDOMEN AND PELVIS WITHOUT AND WITH CONTRAST  Technique: Multidetector CT imaging of the abdomen and pelvis was  performed without contrast material in one or both body regions,  followed by contrast material(s) and further sections in one or  both body regions.  Contrast: OMNIPAQUE IOHEXOL 300 MG/ML SOLN  Comparison: CT abdomen and pelvis of 08/23/2008  Findings: On the unenhanced state, the lung bases are clear.  Surgical clips are present from prior cholecystectomy. No renal  calculi are noted.  After contrast administration, left breast prosthesis is noted.  The liver enhances with no suspicious abnormality and no ductal  dilatation is seen. The pancreas is normal in size and the  pancreatic duct is not dilated. The low attenuation structure  described previously in the distal body the pancreas appears stable  and most likely benign. The adrenal glands and spleen are  unremarkable. The stomach is not well distended. The kidneys  enhance and there is at this parapelvic cyst on the left which is  stable. The abdominal aorta is normal in caliber. There are a few  minimally prominent lymph nodes within the right lower quadrant of  questionable significance.  The appendix is well  seen in the right lower quadrant and appears  normal, as is the terminal ileum. No abnormality of the colon is  seen. There does appear to be a defect in the lower pelvic  anterior abdominal wall musculature with a segment of the proximal  sigmoid colon within the small hernia. The overlying fascial plane  appears intact. The urinary bladder is unremarkable. The uterus  appears to have been removed. No adnexal lesion is seen. No fluid  is noted within the pelvis. No acute bony abnormality is seen.  IMPRESSION:  1. There is a small hernia through the left lower pelvic anterior  abdominal wall musculature containing a nondistended segment of  proximal sigmoid colon.  2. Slightly prominent lymph nodes in the right lower quadrant of  questionable significance.  3. The appendix and terminal ileum appear normal.     Objective:   Physical Exam  Abdominal:         Assessment:     History breast cancer Abdominal pain    Plan:     I am not sure if she has a hernia.  There is no discrete defect but she has tenderness at this site and there is question on the ct scan.  I think would be reasonable at some point to discuss a possible laparoscopy due to symptoms and this finding.  I think this should wait for now though given the constellation of her other symptoms including sob.  She is going to get this evaluated first then return.

## 2012-07-11 ENCOUNTER — Ambulatory Visit (INDEPENDENT_AMBULATORY_CARE_PROVIDER_SITE_OTHER): Payer: Medicare Other | Admitting: Cardiovascular Disease

## 2012-07-11 ENCOUNTER — Encounter: Payer: Self-pay | Admitting: Cardiovascular Disease

## 2012-07-11 VITALS — BP 140/88 | HR 70 | Ht 63.0 in | Wt 187.0 lb

## 2012-07-11 DIAGNOSIS — R0609 Other forms of dyspnea: Secondary | ICD-10-CM

## 2012-07-11 DIAGNOSIS — R0602 Shortness of breath: Secondary | ICD-10-CM

## 2012-07-11 DIAGNOSIS — I10 Essential (primary) hypertension: Secondary | ICD-10-CM

## 2012-07-11 NOTE — Assessment & Plan Note (Addendum)
The patient has severe exertional dyspnea of unclear etiology. I think the first priority is to evaluate for possible underlying obstructive coronary artery disease as this might be angina equivalent. 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 I recommend evaluation with a pharmacologic nuclear stress test. A treadmill stress test alone is not sufficient due to abnormal ECG and left anterior fascicular block. She also has significant neuropathy which makes it hard for her to exercise on a treadmill. The other possibility could be cardiomyopathy due to previous chemotherapy or diastolic dysfunction. Thus, I will obtain an echocardiogram. She is a previous smoker but does not seem to have COPD. Her lung examination is unremarkable. There is a possibility of physical deconditioning contributing to her symptoms but that cardiac etiology will have to be excluded. I reviewed her basic labs which included CBC, basic metabolic profile and liver enzymes. No significant abnormalities were noted.

## 2012-07-11 NOTE — Patient Instructions (Addendum)
Your physician has requested that you have an echocardiogram. Echocardiography is a painless test that uses sound waves to create images of your heart. It provides your doctor with information about the size and shape of your heart and how well your heart's chambers and valves are working. This procedure takes approximately one hour. There are no restrictions for this procedure.  Your physician has requested that you have a lexiscan myoview. For further information please visit [email or web address]. Please follow instruction sheet, as given.  Follow up after tests.  

## 2012-07-11 NOTE — Progress Notes (Signed)
HPI  This is a 66 year old female who is here today for evaluation of exertional dyspnea. The patient is not aware of any previous cardiac history. She had an echocardiogram done in 2010 which showed normal LV systolic function. She had breast cancer in 2009 which was treated with mastectomy and chemotherapy. She did not require radiation therapy. She is a previous smoker but quit in 2005. She used to smoke one to 2 packs per day for about 20 years. She is not aware of any history of COPD or other lung pathology. Over the last year, she has been having dyspnea with minimal activities even walking half a block or going up one flight of stairs. She denies any chest pain. No orthopnea, PND or lower extremity edema. She does not exercise a regular basis due to extensive neuropathy. She used to have frequent pneumonias when she was a smoker but not recently.  No Known Allergies   Current Outpatient Prescriptions on File Prior to Visit  Medication Sig Dispense Refill  . anastrozole (ARIMIDEX) 1 MG tablet Take 1 mg by mouth daily.      Marland Kitchen aspirin 81 MG tablet Take 81 mg by mouth daily.        . celecoxib (CELEBREX) 200 MG capsule Take 200 mg by mouth 2 (two) times daily.      Marland Kitchen co-enzyme Q-10 30 MG capsule Take 30 mg by mouth 3 (three) times daily.      . Cyanocobalamin (B-12 PO) Take one dropperful by mouth daily      . cyclobenzaprine (FLEXERIL) 10 MG tablet Take 10 mg by mouth daily as needed.        . hydrochlorothiazide (MICROZIDE) 12.5 MG capsule Take 1 capsule (12.5 mg total) by mouth daily.  30 capsule  1  . levothyroxine (SYNTHROID, LEVOTHROID) 100 MCG tablet Take 100 mcg by mouth daily.      . sertraline (ZOLOFT) 50 MG tablet Take 1 tablet (50 mg total) by mouth daily.  30 tablet  11     Past Medical History  Diagnosis Date  . Stroke   . Thyroid disease     HYPO  . Restless leg syndrome   . Hx: UTI (urinary tract infection)   . Diverticulitis   . Anemia   . Reiter's disease    DIAGNOSED YEARS AGO IN [place] BY RHEUMATOLOGY  . HLD (hyperlipidemia)   . GERD (gastroesophageal reflux disease)   . Neuromuscular disorder   . Hearing loss   . Nasal congestion   . Abdominal pain   . 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 Hypertension      Past Surgical History  Procedure Date  . Ovary surgery 2006  . Abdominal hysterectomy 1976  . Cholecystectomy 2008  . Hernia repair 2010  . Tram     right tram flap  . Breast surgery 2009    right mastectomy sent node biopsy  . Breast reconstruction     right tram, left implant and lift     Family History  Problem Relation Age of Onset  . Cancer Mother     lung  . Cancer Maternal Grandmother     lung     History   Social History  . Marital Status: Married    Spouse Name: N/A    Number of Children: N/A  . Years of Education: N/A   Occupational History  . Not on file.   Social History Main Topics  .  Smoking status: Former Smoker    Quit date: 11/08/2003  . Smokeless tobacco: Never Used  . Alcohol Use: Yes     rarely  . Drug Use: No  . Sexually Active: Not on file   Other Topics Concern  . Not on file   Social History Narrative  . No narrative on file     ROS Constitutional: Negative for fever, chills, diaphoresis, activity change, appetite change and fatigue.  HENT: Negative for hearing loss, nosebleeds, congestion, sore throat, facial swelling, drooling, trouble swallowing, neck pain, voice change, sinus pressure and tinnitus.  Eyes: Negative for photophobia, pain, discharge and visual disturbance.  Respiratory: Negative for apnea, cough, chest tightness and wheezing.  Cardiovascular: Negative for chest pain and leg swelling.  Gastrointestinal: Negative for nausea, vomiting, abdominal pain, diarrhea, constipation, blood in stool and abdominal distention.  Genitourinary: Negative for dysuria, urgency, frequency, hematuria and decreased urine volume.  Musculoskeletal: Negative for  myalgias, back pain, joint swelling, arthralgias and gait problem.  Skin: Negative for color change, pallor, rash and wound.  Neurological: Negative for dizziness, tremors, seizures, syncope, speech difficulty, weakness, light-headedness, numbness and headaches.  Psychiatric/Behavioral: Negative for suicidal ideas, hallucinations, behavioral problems and agitation. The patient is not nervous/anxious.     PHYSICAL EXAM   BP 140/88  Pulse 70  Ht 5\' 3"  (1.6 m)  Wt 187 lb (84.823 kg)  BMI 33.13 kg/m2 Constitutional: She is oriented to person, place, and time. She appears well-developed and well-nourished. No distress.  HENT: No nasal discharge.  Head: Normocephalic and atraumatic.  Eyes: Pupils are equal and round. Right eye exhibits no discharge. Left eye exhibits no discharge.  Neck: Normal range of motion. Neck supple. No JVD present. No thyromegaly present.  Cardiovascular: Normal rate, regular rhythm, normal heart sounds. Exam reveals no gallop and no friction rub. No murmur heard.  Pulmonary/Chest: Effort normal and breath sounds normal. No stridor. No respiratory distress. She has no wheezes. She has no rales. She exhibits no tenderness.  Abdominal: Soft. Bowel sounds are normal. She exhibits no distension. There is no tenderness. There is no rebound and no guarding.  Musculoskeletal: Normal range of motion. She exhibits no edema and no tenderness.  Neurological: She is alert and oriented to person, place, and time. Coordination normal.  Skin: Skin is warm and dry. No rash noted. She is not diaphoretic. No erythema. No pallor.  Psychiatric: She has a normal mood and affect. Her behavior is normal. Judgment and thought content normal.     EKG: Sinus  Rhythm  -Left axis -anterior fascicular block.   Voltage criteria for LVH  (R(aVL) exceeds 1.26 mV)  -Voltage criteria w/o ST/T abnormality may be normal.   -Old septal infarct.   ABNORMAL    ASSESSMENT AND PLAN

## 2012-07-11 NOTE — Assessment & Plan Note (Signed)
Blood pressure is reasonably controlled and small dose hydrochlorothiazide.

## 2012-07-15 ENCOUNTER — Ambulatory Visit: Payer: Self-pay | Admitting: Cardiovascular Disease

## 2012-07-15 DIAGNOSIS — R079 Chest pain, unspecified: Secondary | ICD-10-CM

## 2012-07-18 ENCOUNTER — Other Ambulatory Visit: Payer: Self-pay

## 2012-07-18 ENCOUNTER — Telehealth: Payer: Self-pay | Admitting: *Deleted

## 2012-07-18 ENCOUNTER — Other Ambulatory Visit (INDEPENDENT_AMBULATORY_CARE_PROVIDER_SITE_OTHER): Payer: Medicare Other

## 2012-07-18 DIAGNOSIS — R0602 Shortness of breath: Secondary | ICD-10-CM

## 2012-07-18 NOTE — Telephone Encounter (Signed)
Message copied by Kendrick Fries on Mon Jul 18, 2012  8:20 AM ------      Message from: [facility], MELISSA E      Created: Fri Jul 15, 2012  1:46 PM      Regarding: FW: stress test                   ----- Message -----         From: Iran Ouch, MD         Sent: 07/15/2012   1:01 PM           To: Marcelle Overlie, RN      Subject: stress test                                              Inform her that stress test was normal.

## 2012-07-18 NOTE — Telephone Encounter (Signed)
LMTCB to inform pt of normal stress results.

## 2012-07-22 ENCOUNTER — Encounter: Payer: Self-pay | Admitting: Cardiovascular Disease

## 2012-07-22 ENCOUNTER — Ambulatory Visit (INDEPENDENT_AMBULATORY_CARE_PROVIDER_SITE_OTHER): Payer: Medicare Other | Admitting: Cardiovascular Disease

## 2012-07-22 VITALS — BP 142/82 | HR 86 | Ht 63.0 in | Wt 185.2 lb

## 2012-07-22 DIAGNOSIS — R0609 Other forms of dyspnea: Secondary | ICD-10-CM

## 2012-07-22 DIAGNOSIS — R0989 Other specified symptoms and signs involving the circulatory and respiratory systems: Secondary | ICD-10-CM

## 2012-07-22 NOTE — Assessment & Plan Note (Signed)
The patient reports significant exertional dyspnea without clear explanation at this point. Her cardiac workup included a nuclear stress test as well as an echocardiogram. Both of them were overall unremarkable. The findings on the echocardiogram did not explain the severity of her symptoms. I did explain to her that a stress test is not always 100% accurate and there are cases of false-negative results. However, I think it's reasonable to look for other possible alternative etiologies for her symptoms. She is a previous smoker. She also reports symptoms suggestive of sleep apnea. Thus, I recommend pulmonary evaluation. I will refer her to Dr. Kendrick Fries to consider pulmonary function testing and possible sleep study. I will have her followup with me in 2 months. If there is still no explanation for her symptoms, I will consider proceeding with a right and left cardiac catheterization for a definitive cardiac diagnosis.

## 2012-07-22 NOTE — Patient Instructions (Addendum)
Refer to Dr. Kendrick Fries for a pulmonary consult.  Follow up in 2 months.

## 2012-07-22 NOTE — Progress Notes (Signed)
HPI  This is a 66 year old female who is here today for a followup visit regarding exertional dyspnea. The patient is not aware of any previous cardiac history.  She had breast cancer in 2009 which was treated with mastectomy and chemotherapy. She did not require radiation therapy. She is a previous smoker but quit in 2005. She used to smoke one to 2 packs per day for about 20 years. She is not aware of any history of COPD or other lung pathology. Over the last year, she has been having dyspnea with minimal activities even walking half a block or going up one flight of stairs. She denies any chest pain. No orthopnea, PND or lower extremity edema. She does not exercise a regular basis due to extensive neuropathy. She used to have frequent pneumonias when she was a smoker but not recently. She underwent a pharmacologic nuclear stress test which showed no evidence of ischemia with normal ejection fraction. An echocardiogram showed only mild diastolic dysfunction, mild MR and no evidence of pulmonary hypertension. He continues to have significant exertional dyspnea. She also reports loud snoring at night and feeling tired throughout the day. She is not aware of history of sleep apnea.  No Known Allergies   Current Outpatient Prescriptions on File Prior to Visit  Medication Sig Dispense Refill  . anastrozole (ARIMIDEX) 1 MG tablet Take 1 mg by mouth daily.      Marland Kitchen aspirin 81 MG tablet Take 81 mg by mouth daily.        . celecoxib (CELEBREX) 200 MG capsule Take 200 mg by mouth daily.       Marland Kitchen co-enzyme Q-10 30 MG capsule Take 30 mg by mouth daily.       . Cyanocobalamin (B-12 PO) Take one dropperful by mouth daily       . cyclobenzaprine (FLEXERIL) 10 MG tablet Take 10 mg by mouth daily as needed.       . hydrochlorothiazide (MICROZIDE) 12.5 MG capsule Take 1 capsule (12.5 mg total) by mouth daily.  30 capsule  1  . levothyroxine (SYNTHROID, LEVOTHROID) 100 MCG tablet Take 100 mcg by mouth daily.       . sertraline (ZOLOFT) 50 MG tablet Take 1 tablet (50 mg total) by mouth daily.  30 tablet  11     Past Medical History  Diagnosis Date  . Stroke   . Thyroid disease     HYPO  . Restless leg syndrome   . Hx: UTI (urinary tract infection)   . Diverticulitis   . Anemia   . Reiter's disease     DIAGNOSED YEARS AGO IN [place] BY RHEUMATOLOGY  . HLD (hyperlipidemia)   . GERD (gastroesophageal reflux disease)   . Neuromuscular disorder   . Hearing loss   . Nasal congestion   . Abdominal pain   . 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 Hypertension      Past Surgical History  Procedure Date  . Ovary surgery 2006  . Abdominal hysterectomy 1976  . Cholecystectomy 2008  . Hernia repair 2010  . Tram     right tram flap  . Breast surgery 2009    right mastectomy sent node biopsy  . Breast reconstruction     right tram, left implant and lift     Family History  Problem Relation Age of Onset  . Cancer Mother     lung  . Cancer Maternal Grandmother     lung  History   Social History  . Marital Status: Married    Spouse Name: N/A    Number of Children: N/A  . Years of Education: N/A   Occupational History  . Not on file.   Social History Main Topics  . Smoking status: Former Smoker    Quit date: 11/08/2003  . Smokeless tobacco: Never Used  . Alcohol Use: No     rarely  . Drug Use: No  . Sexually Active: Not on file   Other Topics Concern  . Not on file   Social History Narrative  . No narrative on file        PHYSICAL EXAM   BP 142/82  Pulse 86  Ht 5\' 3"  (1.6 m)  Wt 185 lb 4 oz (84.029 kg)  BMI 32.82 kg/m2  SpO2 95% Constitutional: She is oriented to person, place, and time. She appears well-developed and well-nourished. No distress.  HENT: No nasal discharge.  Head: Normocephalic and atraumatic.  Eyes: Pupils are equal and round. Right eye exhibits no discharge. Left eye exhibits no discharge.  Neck: Normal range of motion. Neck  supple. No JVD present. No thyromegaly present.  Cardiovascular: Normal rate, regular rhythm, normal heart sounds. Exam reveals no gallop and no friction rub. No murmur heard.  Pulmonary/Chest: Effort normal and breath sounds normal. No stridor. No respiratory distress. She has no wheezes. She has no rales. She exhibits no tenderness.  Abdominal: Soft. Bowel sounds are normal. She exhibits no distension. There is no tenderness. There is no rebound and no guarding.  Musculoskeletal: Normal range of motion. She exhibits no edema and no tenderness.  Neurological: She is alert and oriented to person, place, and time. Coordination normal.  Skin: Skin is warm and dry. No rash noted. She is not diaphoretic. No erythema. No pallor.  Psychiatric: She has a normal mood and affect. Her behavior is normal. Judgment and thought content normal.       ASSESSMENT AND PLAN

## 2012-08-10 ENCOUNTER — Telehealth: Payer: Self-pay | Admitting: Oncology

## 2012-08-10 ENCOUNTER — Ambulatory Visit (HOSPITAL_BASED_OUTPATIENT_CLINIC_OR_DEPARTMENT_OTHER): Payer: Medicare Other | Admitting: Oncology

## 2012-08-10 ENCOUNTER — Encounter: Payer: Self-pay | Admitting: Oncology

## 2012-08-10 ENCOUNTER — Other Ambulatory Visit (HOSPITAL_BASED_OUTPATIENT_CLINIC_OR_DEPARTMENT_OTHER): Payer: Medicare Other | Admitting: Lab

## 2012-08-10 VITALS — BP 144/85 | HR 87 | Temp 98.7°F | Resp 20 | Ht 63.0 in | Wt 187.1 lb

## 2012-08-10 DIAGNOSIS — R0602 Shortness of breath: Secondary | ICD-10-CM

## 2012-08-10 DIAGNOSIS — Z853 Personal history of malignant neoplasm of breast: Secondary | ICD-10-CM

## 2012-08-10 DIAGNOSIS — R42 Dizziness and giddiness: Secondary | ICD-10-CM

## 2012-08-10 DIAGNOSIS — C50919 Malignant neoplasm of unspecified site of unspecified female breast: Secondary | ICD-10-CM

## 2012-08-10 DIAGNOSIS — Z9010 Acquired absence of unspecified breast and nipple: Secondary | ICD-10-CM

## 2012-08-10 LAB — COMPREHENSIVE METABOLIC PANEL (CC13)
Albumin: 3.4 g/dL — ABNORMAL LOW (ref 3.5–5.0)
Alkaline Phosphatase: 101 U/L (ref 40–150)
BUN: 15 mg/dL (ref 7.0–26.0)
Calcium: 9.7 mg/dL (ref 8.4–10.4)
Chloride: 103 mEq/L (ref 98–107)
Glucose: 182 mg/dl — ABNORMAL HIGH (ref 70–99)
Potassium: 3.7 mEq/L (ref 3.5–5.1)
Sodium: 138 mEq/L (ref 136–145)
Total Protein: 7.8 g/dL (ref 6.4–8.3)

## 2012-08-10 LAB — CBC WITH DIFFERENTIAL/PLATELET
Basophils Absolute: 0 10*3/uL (ref 0.0–0.1)
Eosinophils Absolute: 0.2 10*3/uL (ref 0.0–0.5)
HGB: 13.5 g/dL (ref 11.6–15.9)
MCV: 97.9 fL (ref 79.5–101.0)
MONO#: 0.4 10*3/uL (ref 0.1–0.9)
NEUT#: 4.3 10*3/uL (ref 1.5–6.5)
RBC: 3.94 10*6/uL (ref 3.70–5.45)
RDW: 13.1 % (ref 11.2–14.5)
WBC: 7.1 10*3/uL (ref 3.9–10.3)
lymph#: 2.2 10*3/uL (ref 0.9–3.3)

## 2012-08-10 NOTE — Progress Notes (Signed)
OFFICE PROGRESS NOTE  CC  Ruthe Mannan, MD [address] [place] [address] [place] [address] Dr. Emelia Loron Dr. 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  PRIOR THERAPY:  #1 status post right mastectomy in October 2009.  #2 she then went on to receive adjuvant chemotherapy consisting of 4 cycles of Taxotere and Cytoxan administered from 09/20/2008 through 11/21/2008.  #3 she received Arimidex 1 mg daily starting 01/29/2009.  #4 status post TRAM reconstruction of the right breast on 02/21/2009.  CURRENT THERAPY:Arimidex 1 mg daily  INTERVAL HISTORY: Shannon [name] 66 y.o. female returns for Followup visit today. Patient has multiple complaints including headaches all the time getting dizzy and lightheadedness. She had MRIs performed of the abdomen that showed a spot of which was felt not to be on any masses but possibly a hernia. She also has had significant amount of stress and anxiety and she is not sleeping as well. She continues to have shortness of breath he's had a complete workup from a cardiology perspective including EKG echocardiograms and stress test and these have apparently all been negative. There is a question of whether she has sleep apnea and she may be contemplating getting a sleep study performed. She is continuing to take the a remedy axilla does experience significant amount of hot flashes she denies having any aches. She has no nausea or vomiting. She is very concerned about her weight gain which is ongoing. MEDICAL HISTORY: Past Medical History  Diagnosis Date  . Stroke   . Thyroid disease     HYPO  . Restless leg syndrome   . Hx: UTI (urinary tract infection)   . Diverticulitis   . Anemia   . Reiter's disease     DIAGNOSED YEARS AGO IN [place] BY RHEUMATOLOGY  . HLD (hyperlipidemia)   . GERD (gastroesophageal reflux disease)   . Neuromuscular  disorder   . Hearing loss   . Nasal congestion   . Abdominal pain   . 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 Hypertension     ALLERGIES:   has no known allergies.  MEDICATIONS:  Current Outpatient Prescriptions  Medication Sig Dispense Refill  . anastrozole (ARIMIDEX) 1 MG tablet Take 1 mg by mouth daily.      Marland Kitchen aspirin 81 MG tablet Take 81 mg by mouth daily.        . celecoxib (CELEBREX) 200 MG capsule Take 200 mg by mouth daily.       Marland Kitchen co-enzyme Q-10 30 MG capsule Take 30 mg by mouth daily.       . Cyanocobalamin (B-12 PO) Take one dropperful by mouth daily       . hydrochlorothiazide (MICROZIDE) 12.5 MG capsule Take 1 capsule (12.5 mg total) by mouth daily.  30 capsule  1  . levothyroxine (SYNTHROID, LEVOTHROID) 100 MCG tablet Take 100 mcg by mouth daily.      . sertraline (ZOLOFT) 50 MG tablet Take 1 tablet (50 mg total) by mouth daily.  30 tablet  11  . cyclobenzaprine (FLEXERIL) 10 MG tablet Take 10 mg by mouth daily as needed.         SURGICAL HISTORY:  Past Surgical History  Procedure Date  . Ovary surgery 2006  . Abdominal hysterectomy 1976  . Cholecystectomy 2008  . Hernia repair 2010  . Tram     right tram flap  . Breast  surgery 2009    right mastectomy sent node biopsy  . Breast reconstruction     right tram, left implant and lift    REVIEW OF SYSTEMS:  Pertinent items are noted in HPI.   PHYSICAL EXAMINATION: General appearance: alert, cooperative and appears stated age Neck: no adenopathy, no carotid bruit, no JVD, supple, symmetrical, trachea midline and thyroid not enlarged, symmetric, no tenderness/mass/nodules Lymph nodes: Cervical, supraclavicular, and axillary nodes normal. Resp: clear to auscultation bilaterally and normal percussion bilaterally Back: symmetric, no curvature. ROM normal. No CVA tenderness. Cardio: regular rate and rhythm, S1, S2 normal, no murmur, click, rub or gallop GI: soft, non-tender; bowel sounds normal; no masses,  no  organomegaly Extremities: extremities normal, atraumatic, no cyanosis or edema Neurologic: Grossly normal Bilateral breast examination right constructed breast is without any skin changes well healed surgical scar. Left breast no masses nipple discharge no skin changes. ECOG PERFORMANCE STATUS: 1 - Symptomatic but completely ambulatory  Blood pressure 144/85, pulse 87, temperature 98.7 F (37.1 C), temperature source Oral, resp. rate 20, height 5\' 3"  (1.6 m), weight 187 lb 1.6 oz (84.868 kg).  LABORATORY DATA: Lab Results  Component Value Date   WBC 7.1 08/10/2012   HGB 13.5 08/10/2012   HCT 38.5 08/10/2012   MCV 97.9 08/10/2012   PLT 277 08/10/2012       RADIOGRAPHIC STUDIES:  No results found.  ASSESSMENT: 66 year old female with  #1 stage II lobular carcinoma of the right breast status post mastectomy. She is status post adjuvant chemotherapy consisting of Taxotere and Cytoxan. She is now on Arimidex overall she is doing well she has no evidence of recurrent disease.  #2 shortness of breath   #3 dizziness and lightheadedness unclear etiology question really arises whether this could possibly be related to the Arimidex. She and I discussed holding the Arimidex for at least 6 weeks and reevaluating her. She is instructed to call me in 6 weeks time to see what her symptoms are like.  PLAN:  #1 hold Arimidex for 6 weeks. Patient is instructed to call me back.  #2 in meantime I will plan on seeing her back in 6 months time.  #3 patient is considering relocating to [place] and she will call me if this happens sooner than 6 months.   All questions were answered. The patient knows to call the clinic with any problems, questions or concerns. We can certainly see the patient much sooner if necessary.  I spent 30 minutes counseling the patient face to face. The total time spent in the appointment was 30 minutes.    Drue Second, MD Medical/Oncology [facility] [place] [phone] (beeper) [phone] (Office)  08/10/2012, 9:19 AM

## 2012-08-10 NOTE — Patient Instructions (Addendum)
Due to side effects possibly from arimidex please hold it for 6 weeks and then call me to see if your present symptoms have improved or not.  I will see you back in 6 months

## 2012-08-10 NOTE — Telephone Encounter (Signed)
gve the pt her April 2014 appt calendar °

## 2012-08-18 ENCOUNTER — Ambulatory Visit (INDEPENDENT_AMBULATORY_CARE_PROVIDER_SITE_OTHER): Payer: Medicare Other | Admitting: Pulmonary Disease

## 2012-08-18 ENCOUNTER — Encounter: Payer: Self-pay | Admitting: Pulmonary Disease

## 2012-08-18 VITALS — BP 122/80 | HR 72 | Temp 97.5°F | Ht 63.0 in | Wt 185.8 lb

## 2012-08-18 DIAGNOSIS — R0989 Other specified symptoms and signs involving the circulatory and respiratory systems: Secondary | ICD-10-CM

## 2012-08-18 DIAGNOSIS — R0600 Dyspnea, unspecified: Secondary | ICD-10-CM

## 2012-08-18 DIAGNOSIS — E039 Hypothyroidism, unspecified: Secondary | ICD-10-CM

## 2012-08-18 DIAGNOSIS — R0609 Other forms of dyspnea: Secondary | ICD-10-CM

## 2012-08-18 NOTE — Assessment & Plan Note (Signed)
If she is undertreated or hypothyroid again this could be the best diagnosis for her symptoms.  Plan: -check TSH

## 2012-08-18 NOTE — Patient Instructions (Addendum)
We will schedule a sleep study to evaluate your fatigue in [place]. We will follow up the results with you.  Try using the spiriva we gave you to see if it helps with your shortness of breath Follow up with your other doctors and continue the plan they have laid out for you (holding Arimidex, having a heart catheterization). We will call you with the results of the sleep study and see you back in clinic if needed (depending on the results).

## 2012-08-18 NOTE — Assessment & Plan Note (Addendum)
Given Ms. Shannon [name]'s normal CXR, exertional oxygenation, and essentially normal spirometry she does not have clear evidence of lung disease.  She certainly does not have COPD.  Her flow volume loop showed some late "scooping" which could be suggestive of small airways disease, but this is really a soft call.  She describes general malaise and sweats as prominent features of her illness even while at rest.  These are not typical features of lung disease and suggest another etiology.  She has fairly classic symptoms of sleep apnea which could possibly explain the fatigue/malaise.    Plan: -try a trial (20 days) of Spiriva given possible small airways disease -agree with holding Arimidex briefly  -agree with proceeding with LHC/RHC -I have ordered a sleep study -see again in 6 weeks

## 2012-08-18 NOTE — Progress Notes (Signed)
Subjective:    Patient ID: Shannon [name], female    DOB: Oct 02, 1946, 66 y.o.   MRN: [id number]  HPI  Shannon [name] is a very pleasant 66 year old female who comes her clinic today for evaluation of shortness of breath. She had a normal childhood without respiratory illnesses but developed shortness of breath, exhaustion, and intermittent sweats about one year ago. In 2010 she was diagnosed with rest cancer the street with a mastectomy and chemotherapy. She has been on Armidex for the last year and this was recently held (about 2 weeks ago) to see if this is contributing to her shortness of breath. She is currently undergoing an evaluation by cardiologist and a recent stress test and echocardiogram were read as normal. There is plans for a left heart catheterization if further workup does not show evidence of pulmonary or other disease.  She smoked 1-1/2 packs of cigarettes daily for 20 years and quit in 2005. She does not have cough and she does not have sinus symptoms. She states that she gets short of breath when walking up stairs and often cannot make it through a store without having to stop. She does believe she can walk more than 100 yards without stopping. When asked specifically about her symptoms it sounds that exhaustion and general fatigue is more of a problem and shortness of breath but she does note distinct shortness of breath when climbing stairs. During this time (the last year) she has noticed increasing night sweats. She also feels that she feels hot all the time when others feel cold.  She also notes that she naps at least 3 hours a day but still feels exhausted. She feels that she needs to take a nap on a regular basis and her husband notes that she snores. She has not had a sleep study yet.  She has very little reflux symptoms.   Past Medical History  Diagnosis Date  . Stroke   . Thyroid disease     HYPO  . Restless leg syndrome   . Hx: UTI (urinary tract infection)   .  Diverticulitis   . Anemia   . Reiter's disease     DIAGNOSED YEARS AGO IN [place] BY RHEUMATOLOGY  . HLD (hyperlipidemia)   . GERD (gastroesophageal reflux disease)   . Neuromuscular disorder   . Hearing loss   . Nasal congestion   . Abdominal pain   . 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 Hypertension      Family History  Problem Relation Age of Onset  . Lung cancer Mother     was a smoker  . Lung cancer Maternal Grandmother     never a smoker  . Emphysema Father     was a former smoker     History   Social History  . Marital Status: Married    Spouse Name: N/A    Number of Children: N/A  . Years of Education: N/A   Occupational History  . Retired    Social History Main Topics  . Smoking status: Former Smoker -- 1.5 packs/day for 20 years    Types: Cigarettes    Quit date: 11/08/2003  . Smokeless tobacco: Never Used  . Alcohol Use: No     rarely  . Drug Use: No  . Sexually Active: Yes   Other Topics Concern  . Not on file   Social History Narrative  . No narrative on file     No Known Allergies  Outpatient Prescriptions Prior to Visit  Medication Sig Dispense Refill  . aspirin 81 MG tablet Take 81 mg by mouth daily.        . celecoxib (CELEBREX) 200 MG capsule Take 200 mg by mouth daily.       Marland Kitchen co-enzyme Q-10 30 MG capsule Take 30 mg by mouth daily.       . Cyanocobalamin (B-12 PO) Take one dropperful by mouth daily       . cyclobenzaprine (FLEXERIL) 10 MG tablet Take 10 mg by mouth daily as needed.       . hydrochlorothiazide (MICROZIDE) 12.5 MG capsule Take 1 capsule (12.5 mg total) by mouth daily.  30 capsule  1  . levothyroxine (SYNTHROID, LEVOTHROID) 100 MCG tablet Take 100 mcg by mouth daily.      . sertraline (ZOLOFT) 50 MG tablet Take 1 tablet (50 mg total) by mouth daily.  30 tablet  11  . anastrozole (ARIMIDEX) 1 MG tablet Take 1 mg by mouth daily. HOLD          Review of Systems  Constitutional: Negative for fever, chills and  unexpected weight change.  HENT: Negative for ear pain, nosebleeds, congestion, sore throat, rhinorrhea, sneezing, trouble swallowing, dental problem, voice change, postnasal drip and sinus pressure.   Eyes: Negative for visual disturbance.  Respiratory: Positive for shortness of breath. Negative for cough and choking.   Cardiovascular: Negative for chest pain and leg swelling.  Gastrointestinal: Positive for abdominal pain. Negative for vomiting and diarrhea.  Genitourinary: Negative for difficulty urinating.  Musculoskeletal: Positive for arthralgias.  Skin: Negative for rash.  Neurological: Positive for headaches. Negative for tremors and syncope.  Hematological: Does not bruise/bleed easily.       Objective:   Physical Exam  Filed Vitals:   08/18/12 1431  BP: 122/80  Pulse: 72  Temp: 97.5 F (36.4 C)  TempSrc: Oral  Height: 5\' 3"  (1.6 m)  Weight: 185 lb 12.8 oz (84.278 kg)  SpO2: 94%   Gen: well appearing, no acute distress HEENT: NCAT, PERRL, EOMi, OP clear, neck supple without masses PULM: CTA B CV: RRR, no mgr, no JVD AB: BS+, soft, nontender, no hsm Ext: warm, no edema, no clubbing, no cyanosis Derm: no rash or skin breakdown Neuro: A&Ox4, CN II-XII intact, strength 5/5 in all 4 extremities  In office simple spirometry>> Normal ratio, mild delay at end expiration but no clear obstruction     Assessment & Plan:   DOE (dyspnea on exertion) Given [name]'s normal CXR, exertional oxygenation, and essentially normal spirometry she does not have clear evidence of lung disease.  She certainly does not have COPD.  Her flow volume loop showed some late "scooping" which could be suggestive of small airways disease, but this is really a soft call.  She describes general malaise and sweats as prominent features of her illness even while at rest.  These are not typical features of lung disease and suggest another etiology.  She has fairly classic symptoms of sleep apnea  which could possibly explain the fatigue/malaise.    Plan: -try a trial (20 days) of Spiriva given possible small airways disease -agree with holding Arimidex briefly  -agree with proceeding with LHC/RHC -I have ordered a sleep study -see again in 6 weeks  HYPOTHYROIDISM If she is undertreated or hypothyroid again this could be the best diagnosis for her symptoms.  Plan: -check TSH   Updated Medication List Outpatient Encounter Prescriptions as of 08/18/2012  Medication Sig  Dispense Refill  . aspirin 81 MG tablet Take 81 mg by mouth daily.        . celecoxib (CELEBREX) 200 MG capsule Take 200 mg by mouth daily.       Marland Kitchen co-enzyme Q-10 30 MG capsule Take 30 mg by mouth daily.       . Cyanocobalamin (B-12 PO) Take one dropperful by mouth daily       . cyclobenzaprine (FLEXERIL) 10 MG tablet Take 10 mg by mouth daily as needed.       . hydrochlorothiazide (MICROZIDE) 12.5 MG capsule Take 1 capsule (12.5 mg total) by mouth daily.  30 capsule  1  . levothyroxine (SYNTHROID, LEVOTHROID) 100 MCG tablet Take 100 mcg by mouth daily.      . sertraline (ZOLOFT) 50 MG tablet Take 1 tablet (50 mg total) by mouth daily.  30 tablet  11  . anastrozole (ARIMIDEX) 1 MG tablet Take 1 mg by mouth daily. HOLD

## 2012-08-19 ENCOUNTER — Telehealth: Payer: Self-pay | Admitting: *Deleted

## 2012-08-19 DIAGNOSIS — R5383 Other fatigue: Secondary | ICD-10-CM

## 2012-08-19 NOTE — Telephone Encounter (Signed)
Spoke with pt and asked if she could come by the lab today and have her thyroid fx checked. She refused. States that her hypothyroidism is under control, sees endocrinologist and had TSH checked 1 month ago which she states was within normal range. Will forward to Dr Kendrick Fries so he is aware.

## 2012-08-19 NOTE — Telephone Encounter (Signed)
Message copied by Christen Butter on Fri Aug 19, 2012 10:32 AM ------      Message from: Max Fickle B      Created: Thu Aug 18, 2012  8:41 PM       L,            Can we have this lady get a TSH STAT?  If she is hypothyroid then she really doesn't need any more testing (ie. Our sleep study, etc.)            Forgot yesterday.            Thanks,      Kipp Brood

## 2012-08-22 NOTE — Telephone Encounter (Signed)
OK 

## 2012-08-24 ENCOUNTER — Other Ambulatory Visit: Payer: Self-pay | Admitting: *Deleted

## 2012-08-24 MED ORDER — HYDROCHLOROTHIAZIDE 12.5 MG PO CAPS: 12.5000 mg | ORAL_CAPSULE | Freq: Every day | ORAL | Status: DC

## 2012-08-30 ENCOUNTER — Other Ambulatory Visit: Payer: Self-pay | Admitting: *Deleted

## 2012-08-30 MED ORDER — CELECOXIB 200 MG PO CAPS: 200.0000 mg | ORAL_CAPSULE | Freq: Every day | ORAL | Status: DC

## 2012-09-06 ENCOUNTER — Other Ambulatory Visit (INDEPENDENT_AMBULATORY_CARE_PROVIDER_SITE_OTHER): Payer: Self-pay | Admitting: General Surgery

## 2012-09-06 DIAGNOSIS — Z9012 Acquired absence of left breast and nipple: Secondary | ICD-10-CM

## 2012-09-06 DIAGNOSIS — Z853 Personal history of malignant neoplasm of breast: Secondary | ICD-10-CM

## 2012-09-07 ENCOUNTER — Telehealth: Payer: Self-pay | Admitting: Pulmonary Disease

## 2012-09-07 MED ORDER — TIOTROPIUM BROMIDE MONOHYDRATE 18 MCG IN CAPS: 18.0000 ug | ORAL_CAPSULE | Freq: Every day | RESPIRATORY_TRACT | Status: DC

## 2012-09-07 NOTE — Telephone Encounter (Signed)
Spoke with patient, she states since sampling spiriva, she can tell a big difference in her breathing.  Reports she is still SOB but she can "do things now."  Patient would like Rx sent in to [facility] for spiriva.  Rx sent in, patient is aware and nothing further needed at this time.

## 2012-09-09 ENCOUNTER — Other Ambulatory Visit: Payer: Self-pay | Admitting: Cardiovascular Disease

## 2012-09-09 DIAGNOSIS — R0602 Shortness of breath: Secondary | ICD-10-CM

## 2012-09-12 ENCOUNTER — Ambulatory Visit (HOSPITAL_BASED_OUTPATIENT_CLINIC_OR_DEPARTMENT_OTHER): Payer: Medicare Other | Attending: Pulmonary Disease

## 2012-09-12 VITALS — Ht 63.0 in | Wt 180.0 lb

## 2012-09-12 DIAGNOSIS — G4733 Obstructive sleep apnea (adult) (pediatric): Secondary | ICD-10-CM | POA: Insufficient documentation

## 2012-09-12 DIAGNOSIS — R5381 Other malaise: Secondary | ICD-10-CM | POA: Insufficient documentation

## 2012-09-12 DIAGNOSIS — R5383 Other fatigue: Secondary | ICD-10-CM

## 2012-09-13 DIAGNOSIS — G4710 Hypersomnia, unspecified: Secondary | ICD-10-CM

## 2012-09-13 DIAGNOSIS — G4730 Sleep apnea, unspecified: Secondary | ICD-10-CM

## 2012-09-13 NOTE — Procedures (Cosign Needed)
NAME:  Shannon [name], Shannon [name] NO.:  [id number]  MEDICAL RECORD NO.:  [id number]          PATIENT TYPE:  OUT  LOCATION:  SLEEP CENTER                 FACILITY:  [facility]  PHYSICIAN:  Barbaraann Share, MD,FCCPDATE OF BIRTH:  04-28-1947  DATE OF STUDY:                           NOCTURNAL POLYSOMNOGRAM  REFERRING PHYSICIAN:  DOUGLAS Heber Milford, MD  LOCATION:  Sleep Lab.  REFERRING PHYSICIAN:  Veto Kemps, MD  INDICATION FOR STUDY:  Hypersomnia with sleep apnea.  EPWORTH SLEEPINESS SCORE:  12.  MEDICATIONS:  SLEEP ARCHITECTURE:  The patient had total sleep time of 295 minutes with no slow-wave sleep and decreased quantity of REM. Sleep onset latency was mildly prolonged at 36 minutes, and REM onset was at the upper limits of normal at 128 minutes.  Sleep efficiency was moderately reduced at 80%.  RESPIRATORY DATA:  The patient was found to have 6 apneas and 20 obstructive hypopneas, giving her an apnea-hypopnea index of 5 events per hour.  The events were more prominent in the supine position and also during REM.  Loud snoring was noted throughout.  OXYGEN DATA:  There was O2 desaturation as low as 84% with the patient's obstructive events.  CARDIAC DATA:  Occasional self limiting sinus tachycardia without arrhythmia noted.  MOVEMENT-PARASOMNIA:  The patient had no significant leg jerks or other abnormal behavior seen.  IMPRESSIONS-RECOMMENDATIONS:  Minimal obstructive sleep apnea/hypopnea syndrome with an AHI of 5 events per hour and oxygen desaturation as low as 84%.  Treatment for this degree of sleep apnea can include a trial of weight loss alone, upper airway surgery, dental appliance, and also CPAP.  The decision to treat this very mild degree of sleep apnea should depend upon its impact to her quality of life, since it does not represent an increased cardiovascular risk.     Barbaraann Share, MD,FCCP Diplomate, American Board of  Sleep Medicine    [facility]  D:  09/13/2012 08:46:00  T:  09/13/2012 11:08:14  Job:  [id number]

## 2012-09-19 ENCOUNTER — Telehealth: Payer: Self-pay | Admitting: *Deleted

## 2012-09-19 NOTE — Telephone Encounter (Signed)
Spoke with pt and notified of results per Dr. Collene Schlichter. Pt verbalized understanding and denied any questions. Appt scheduled for tomorrow am to review results.

## 2012-09-19 NOTE — Telephone Encounter (Signed)
Message copied by Christen Butter on Mon Sep 19, 2012  9:12 AM ------      Message from: Max Fickle B      Created: Mon Sep 19, 2012  8:28 AM       L,            Please let her know that she did not have significant sleep apnea on her sleep study and we will discuss further on her next visit with [place].            B

## 2012-09-19 NOTE — Telephone Encounter (Signed)
Message copied by Christen Butter on Mon Sep 19, 2012  9:09 AM ------      Message from: Max Fickle B      Created: Mon Sep 19, 2012  8:28 AM       L,            Please let her know that she did not have significant sleep apnea on her sleep study and we will discuss further on her next visit with [place].            B

## 2012-09-20 ENCOUNTER — Encounter: Payer: Self-pay | Admitting: Pulmonary Disease

## 2012-09-20 ENCOUNTER — Ambulatory Visit (INDEPENDENT_AMBULATORY_CARE_PROVIDER_SITE_OTHER): Payer: Medicare Other | Admitting: Pulmonary Disease

## 2012-09-20 VITALS — BP 130/78 | HR 77 | Temp 97.6°F | Ht 63.0 in | Wt 187.0 lb

## 2012-09-20 DIAGNOSIS — J449 Chronic obstructive pulmonary disease, unspecified: Secondary | ICD-10-CM

## 2012-09-20 DIAGNOSIS — R5383 Other fatigue: Secondary | ICD-10-CM | POA: Insufficient documentation

## 2012-09-20 NOTE — Progress Notes (Signed)
Subjective:    Patient ID: Shannon [name], female    DOB: 1945/11/29, 66 y.o.   MRN: [id number]  Synopsis: Shannon [name] was evaluated by [place] [facility] in November 2013 for dyspnea and fatigue.  She smoked 1.5 pack of cigarettes daily for at least 20 years and quit in 2005. She was diagnosed with breast cancer in 2010 and treated with a mastectomy, chemo/xrt.  She had simple spirometry consistent with obstruction and was started on Spiriva and did well.  HPI  09/20/2012 ROV -- Since her last visit Shannon [name] has been doing very well. She states that the Spiriva took about 3 weeks to work but it is causing her to feel much less short of breath with exertion and her energy level is up quite a bit. She says she can tell a remarkable difference when climbing a flight of stairs and exerting herself. She is sleeping better throughout the night and she is not having to nap during the day which she did before. She does have a dry cough which started on the Spiriva but this isn't particularly bothersome to her.  Past Medical History  Diagnosis Date  . Stroke   . Thyroid disease     HYPO  . Restless leg syndrome   . Hx: UTI (urinary tract infection)   . Diverticulitis   . Anemia   . Reiter's disease     DIAGNOSED YEARS AGO IN [place] BY RHEUMATOLOGY  . HLD (hyperlipidemia)   . GERD (gastroesophageal reflux disease)   . Neuromuscular disorder   . Hearing loss   . Nasal congestion   . Abdominal pain   . 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 Hypertension       Review of Systems Not performed    Objective:   Physical Exam  Filed Vitals:   09/20/12 1032  BP: 130/78  Pulse: 77  Temp: 97.6 F (36.4 C)  TempSrc: Oral  Height: 5\' 3"  (1.6 m)  Weight: 187 lb (84.823 kg)  SpO2: 95%   Gen: well appearing, no acute distress HEENT: NCAT, PERRL, EOMi, OP clear, neck supple without masses PULM: CTA B CV: RRR, slight systolic murmur, no JVD AB: BS+, soft, nontender, no hsm Ext: warm, no  edema, no clubbing, no cyanosis  November 2013 simple spirometry showed obstruction on the flow volume loop FEV1 was greater than 80% predicted     Assessment & Plan:   COPD (chronic obstructive pulmonary disease) COPD: GOLD Stage 1, Grade A Combined recommendations from the [employer], [employer], Designer, television/film set, European Respiratory Society (Qaseem A et al, Ann Intern Med. 2011;155(3):179) recommends tobacco cessation, pulmonary rehab (for symptomatic patients with an FEV1 < 50% predicted), supplemental oxygen (for patients with SaO2 <88% or paO2 <55), and appropriate bronchodilator therapy.  In regards to long acting bronchodilators, they recommend monotherapy (FEV1 60-80% with symptoms weak evidence, FEV1 with symptoms <60% strong evidence), or combination therapy (FEV1 <60% with symptoms, strong recommendation, moderate evidence).  One should also provide patients with annual immunizations and consider therapy for prevention of COPD exacerbations (ie. roflumilast or azithromycin) when appopriate.  -O2 therapy: Not indicated -Immunizations: Pneumovax UTD, Advised to get flu shot, patient considering it -Tobacco use: Quit 2004 -Exercise: Encouraged to exercise regularly -Bronchodilator therapy: Spiriva -Exacerbation prevention: Spiriva  Fatigue Her fatigue has improved greatly since starting spiriva.  There was no evidence of sleep apnea on her recent PSG.     Updated Medication  List Outpatient Encounter Prescriptions as of 09/20/2012  Medication Sig Dispense Refill  . aspirin 81 MG tablet Take 81 mg by mouth daily.        . celecoxib (CELEBREX) 200 MG capsule Take 1 capsule (200 mg total) by mouth daily.  30 capsule  0  . co-enzyme Q-10 30 MG capsule Take 30 mg by mouth daily.       . Cyanocobalamin (B-12 PO) Take one dropperful by mouth daily       . cyclobenzaprine (FLEXERIL) 10 MG tablet Take 10 mg by mouth daily as needed.        . hydrochlorothiazide (MICROZIDE) 12.5 MG capsule Take 1 capsule (12.5 mg total) by mouth daily.  30 capsule  6  . levothyroxine (SYNTHROID, LEVOTHROID) 100 MCG tablet Take 100 mcg by mouth daily.      . sertraline (ZOLOFT) 50 MG tablet Take 1 tablet (50 mg total) by mouth daily.  30 tablet  11  . tiotropium (SPIRIVA) 18 MCG inhalation capsule Place 1 capsule (18 mcg total) into inhaler and inhale daily.  30 capsule  6  . [DISCONTINUED] anastrozole (ARIMIDEX) 1 MG tablet Take 1 mg by mouth daily. HOLD

## 2012-09-20 NOTE — Assessment & Plan Note (Signed)
Her fatigue has improved greatly since starting spiriva.  There was no evidence of sleep apnea on her recent PSG.

## 2012-09-20 NOTE — Assessment & Plan Note (Signed)
COPD: GOLD Stage 1, Grade A Combined recommendations from the [employer], [employer], Designer, television/film set, European Respiratory Society (Qaseem A et al, Ann Intern Med. 2011;155(3):179) recommends tobacco cessation, pulmonary rehab (for symptomatic patients with an FEV1 < 50% predicted), supplemental oxygen (for patients with SaO2 <88% or paO2 <55), and appropriate bronchodilator therapy.  In regards to long acting bronchodilators, they recommend monotherapy (FEV1 60-80% with symptoms weak evidence, FEV1 with symptoms <60% strong evidence), or combination therapy (FEV1 <60% with symptoms, strong recommendation, moderate evidence).  One should also provide patients with annual immunizations and consider therapy for prevention of COPD exacerbations (ie. roflumilast or azithromycin) when appopriate.  -O2 therapy: Not indicated -Immunizations: Pneumovax UTD, Advised to get flu shot, patient considering it -Tobacco use: Quit 2004 -Exercise: Encouraged to exercise regularly -Bronchodilator therapy: Spiriva -Exacerbation prevention: Spiriva

## 2012-09-20 NOTE — Patient Instructions (Signed)
Exercise regularly, at least 35 minutes of walking daily Keep taking the Spiriva Get a flu shot We will see you back in 6 months or sooner if needed

## 2012-10-04 ENCOUNTER — Encounter (INDEPENDENT_AMBULATORY_CARE_PROVIDER_SITE_OTHER): Payer: Self-pay | Admitting: General Surgery

## 2012-10-04 ENCOUNTER — Ambulatory Visit (INDEPENDENT_AMBULATORY_CARE_PROVIDER_SITE_OTHER): Payer: Medicare Other | Admitting: General Surgery

## 2012-10-04 ENCOUNTER — Other Ambulatory Visit (INDEPENDENT_AMBULATORY_CARE_PROVIDER_SITE_OTHER): Payer: Self-pay | Admitting: General Surgery

## 2012-10-04 ENCOUNTER — Ambulatory Visit
Admission: RE | Admit: 2012-10-04 | Discharge: 2012-10-04 | Disposition: A | Discharge status: Home / Self Care | Payer: Medicare Other | Source: Ambulatory Visit | Attending: General Surgery | Admitting: General Surgery

## 2012-10-04 VITALS — BP 146/82 | HR 74 | Temp 97.6°F | Resp 16 | Ht 63.0 in | Wt 185.4 lb

## 2012-10-04 DIAGNOSIS — Z9012 Acquired absence of left breast and nipple: Secondary | ICD-10-CM

## 2012-10-04 DIAGNOSIS — Z853 Personal history of malignant neoplasm of breast: Secondary | ICD-10-CM

## 2012-10-04 DIAGNOSIS — K432 Incisional hernia without obstruction or gangrene: Secondary | ICD-10-CM

## 2012-10-04 NOTE — Progress Notes (Signed)
Subjective:     Patient ID: Shannon [name], female   DOB: September 29, 1946, 66 y.o.   MRN: [id number]  HPI This is a 66 year old female with a history of a stage IIA right breast cancer treated with a right mastectomy, sentinel node biopsy and a TRAM flap. She's also undergone implant placement as well as a mastopexy on the left side. She had a mammogram today that shows no evidence of any abnormality and is recommended for a routine screening in one year. She had a repeat ultrasound of a focal area of pain in the lower inner quadrant of her left breast which shows no focal discrete cystic or solid lesions. She states that this area still intermittently tender but were otherwise reports no complaints referable to either breast right now. Since I last saw her she had seen a pulmonologist about her shortness of breath. She has since been placed on Spiriva which really has resolved all of her symptoms and she feels back to her normal self.  She still complains of some focal tenderness in the left lower quadrant at the site of her incision. This area still causing her discomfort. There is not really a bulge that is associated with it at all. I examined this area before and I thought that there might be a hernia as well as possibly seen on the CT scan. We discussed the laparoscopy before and were just waiting to get a shortness of breath evaluated.  Review of Systems  Constitutional: Negative for fever, chills and unexpected weight change.  HENT: Negative for congestion, sore throat, trouble swallowing and voice change.   Respiratory: Negative for cough, shortness of breath and wheezing.   Cardiovascular: Negative for chest pain, palpitations and leg swelling.  Gastrointestinal: Positive for abdominal pain. Negative for nausea, vomiting, diarrhea, constipation, blood in stool, abdominal distention and anal bleeding.  Genitourinary: Negative for hematuria, vaginal bleeding and difficulty urinating.    Musculoskeletal: Negative for arthralgias.  Skin: Negative for rash and wound.  Neurological: Negative for seizures, syncope and headaches.  Hematological: Negative for adenopathy. Does not bruise/bleed easily.  Psychiatric/Behavioral: Negative for confusion.   DIGITAL DIAGNOSTIC BILATERAL MAMMOGRAM WITH CAD AND LEFT BREAST  ULTRASOUND:  Comparison: August 07, 2011, July 29, 2010, May 26, 2010  Findings:  ACR Breast Density Category scattered fibroglandular tissue.  CC and MLO views of the left breast, [place] CC and MLO views of  the left breast, right MLO view of the TRAM flap are submitted. No  suspicious abnormality is identified bilaterally. Stable  postsurgical changes identified within the right TRAM flap.  Mammographic images were processed with CAD.  Ultrasound is performed, showing no focal discrete cystic or solid  lesion in the focal area pain lower inner quadrant left breast.  IMPRESSION:  Benign findings.  RECOMMENDATION:  Routine screening mammogram in 1 year.  I have discussed the findings and recommendations with the patient.  Results were also provided in writing at the conclusion of the  visit.      Objective:   Physical Exam  Vitals reviewed. Constitutional: She appears well-developed and well-nourished.  Neck: Neck supple.  Pulmonary/Chest: Right breast exhibits no mass, no skin change and no tenderness. Left breast exhibits tenderness (lower inner quadrant but no mass). Left breast exhibits no inverted nipple, no mass, no nipple discharge and no skin change.    Abdominal: Soft. Bowel sounds are normal. She exhibits no distension. There is tenderness. A hernia (possible hernia llq near incision, point tenderness) is present.  Lymphadenopathy:    She has no cervical adenopathy.    She has no axillary adenopathy.       Right: No supraclavicular adenopathy present.       Left: No supraclavicular adenopathy present.       Assessment:      History breast cancer Possible abdominal wall hernia    Plan:     She has no evidence of recurrence of her breast cancer at all. She is to continue with regular screening.  Her shortness of breath is now resolved. I think it is reasonable to consider trying to take care of I believe there is a likely hernia. I discussed with her today a diagnostic laparoscopy to see if there truly is a hernia at this site. I told her if there is a hernia present and we will plan on doing a laparoscopic hernia repair with mesh. We discussed the conduct of the operation as well as the postoperative course both in the hospital and out of the hospital. I told her that it be about 2-5 days in the hospital. I told her this would be about 3-4 weeks of decreased activity as well as about 2 or 3 months of her coverage. We discussed the risks including bleeding, infection, recurrence, chronic pain as well. I think is reasonable given his CT scan and her continued symptoms to proceed with this. We will plan on doing this after the holidays.

## 2012-10-27 ENCOUNTER — Ambulatory Visit (INDEPENDENT_AMBULATORY_CARE_PROVIDER_SITE_OTHER): Payer: Medicare Other | Admitting: Cardiovascular Disease

## 2012-10-27 ENCOUNTER — Encounter: Payer: Self-pay | Admitting: Cardiovascular Disease

## 2012-10-27 VITALS — BP 128/80 | HR 82 | Ht 63.0 in | Wt 184.0 lb

## 2012-10-27 DIAGNOSIS — R0609 Other forms of dyspnea: Secondary | ICD-10-CM

## 2012-10-27 DIAGNOSIS — I10 Essential (primary) hypertension: Secondary | ICD-10-CM

## 2012-10-27 NOTE — Patient Instructions (Addendum)
Follow up as needed

## 2012-10-27 NOTE — Assessment & Plan Note (Signed)
Blood pressure is well controlled on small dose hydrochlorothiazide.

## 2012-10-27 NOTE — Progress Notes (Signed)
HPI  This is a 67 year old female who is here today for a followup visit regarding exertional dyspnea. The patient is not aware of any previous cardiac history.  She had breast cancer in 2009 which was treated with mastectomy and chemotherapy. She did not require radiation therapy. She is a previous smoker but quit in 2005. She used to smoke one to 2 packs per day for about 20 years.  She underwent a pharmacologic nuclear stress test which showed no evidence of ischemia with normal ejection fraction. An echocardiogram showed only mild diastolic dysfunction, mild MR and no evidence of pulmonary hypertension. He continues to have significant exertional dyspnea.  Based on her negative cardiac workup, I felt that her dyspnea was likely noncardiac. I referred her to Dr. Kendrick Fries. She was started on Spiriva with significant improvement in her symptoms.  No Known Allergies   Current Outpatient Prescriptions on File Prior to Visit  Medication Sig Dispense Refill  . aspirin 81 MG tablet Take 81 mg by mouth daily.        . celecoxib (CELEBREX) 200 MG capsule Take 1 capsule (200 mg total) by mouth daily.  30 capsule  0  . co-enzyme Q-10 30 MG capsule Take 30 mg by mouth daily.       . Cyanocobalamin (B-12 PO) Take one dropperful by mouth daily       . cyclobenzaprine (FLEXERIL) 10 MG tablet Take 10 mg by mouth daily as needed.       . hydrochlorothiazide (MICROZIDE) 12.5 MG capsule Take 1 capsule (12.5 mg total) by mouth daily.  30 capsule  6  . levothyroxine (SYNTHROID, LEVOTHROID) 100 MCG tablet Take 100 mcg by mouth daily.      . sertraline (ZOLOFT) 50 MG tablet Take 1 tablet (50 mg total) by mouth daily.  30 tablet  11  . tiotropium (SPIRIVA) 18 MCG inhalation capsule Place 1 capsule (18 mcg total) into inhaler and inhale daily.  30 capsule  6     Past Medical History  Diagnosis Date  . Stroke   . Thyroid disease     HYPO  . Restless leg syndrome   . Hx: UTI (urinary tract infection)   .  Diverticulitis   . Anemia   . Reiter's disease     DIAGNOSED YEARS AGO IN [place] BY RHEUMATOLOGY  . HLD (hyperlipidemia)   . GERD (gastroesophageal reflux disease)   . Neuromuscular disorder   . Hearing loss   . Nasal congestion   . Abdominal pain   . 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 Hypertension      Past Surgical History  Procedure Date  . Ovary surgery 2006  . Abdominal hysterectomy 1976  . Cholecystectomy 2008  . Hernia repair 2010  . Tram     right tram flap  . Breast surgery 2009    right mastectomy sent node biopsy  . Breast reconstruction     right tram, left implant and lift     Family History  Problem Relation Age of Onset  . Lung cancer Mother     was a smoker  . Lung cancer Maternal Grandmother     never a smoker  . Emphysema Father     was a former smoker     History   Social History  . Marital Status: Married    Spouse Name: N/A    Number of Children: N/A  . Years of Education: N/A   Occupational  History  . Retired    Social History Main Topics  . Smoking status: Former Smoker -- 1.5 packs/day for 20 years    Types: Cigarettes    Quit date: 11/08/2003  . Smokeless tobacco: Never Used  . Alcohol Use: No     Comment: rarely  . Drug Use: No  . Sexually Active: Yes   Other Topics Concern  . Not on file   Social History Narrative  . No narrative on file        PHYSICAL EXAM   BP 128/80  Pulse 82  Ht 5\' 3"  (1.6 m)  Wt 184 lb (83.462 kg)  BMI 32.59 kg/m2 Constitutional: She is oriented to person, place, and time. She appears well-developed and well-nourished. No distress.  HENT: No nasal discharge.  Head: Normocephalic and atraumatic.  Eyes: Pupils are equal and round. Right eye exhibits no discharge. Left eye exhibits no discharge.  Neck: Normal range of motion. Neck supple. No JVD present. No thyromegaly present.  Cardiovascular: Normal rate, regular rhythm, normal heart sounds. Exam reveals no gallop and no  friction rub. No murmur heard.  Pulmonary/Chest: Effort normal and breath sounds normal. No stridor. No respiratory distress. She has no wheezes. She has no rales. She exhibits no tenderness.  Abdominal: Soft. Bowel sounds are normal. She exhibits no distension. There is no tenderness. There is no rebound and no guarding.  Musculoskeletal: Normal range of motion. She exhibits no edema and no tenderness.  Neurological: She is alert and oriented to person, place, and time. Coordination normal.  Skin: Skin is warm and dry. No rash noted. She is not diaphoretic. No erythema. No pallor.  Psychiatric: She has a normal mood and affect. Her behavior is normal. Judgment and thought content normal.       ASSESSMENT AND PLAN

## 2012-10-27 NOTE — Assessment & Plan Note (Signed)
The patient reports significant improvement in her dyspnea after she was started on Spiriva. We have discussed before the possibility of proceeding with a right and left cardiac catheterization given the severity of her symptoms. However, given his significant improvement in her dyspnea, I will not proceed with cardiac cath. Her dyspnea seems to be due to COPD. She is to followup with me as needed.

## 2012-11-09 ENCOUNTER — Encounter (HOSPITAL_COMMUNITY): Payer: Self-pay | Admitting: Pharmacy Technician

## 2012-11-09 ENCOUNTER — Other Ambulatory Visit (HOSPITAL_COMMUNITY): Payer: Self-pay | Admitting: General Surgery

## 2012-11-09 NOTE — Progress Notes (Signed)
eccho 9/13, stress test 10/13, ekg 11/13  chest x ray 9/13, sleep study 11/13 with note LOV Dr Kirke Corin 1/14- neg per note- ALL IN EPIC. LOV Dr Kendrick Fries epic

## 2012-11-09 NOTE — Patient Instructions (Addendum)
20 Shannon [name]  11/09/2012   Your procedure is scheduled on:  11/14/12  MONDAY  Report to [facility] at  0530     AM.  Call this number if you have problems the morning of surgery: [phone]     BRING INHALERS WITH YOU TO HOSPITAL  Remember:   Do not eat food  Or drink :After Midnight. Sunday NIGHT   Take these medicines the morning of surgery with A SIP OF WATER:  Sertaline  Spirivia   .  Contacts, dentures or partial plates can not be worn to surgery  Leave suitcase in the car. After surgery it may be brought to your room.  For patients admitted to the hospital, checkout time is 11:00 AM day of  discharge.             SPECIAL INSTRUCTIONS- SEE [facility] PREPARING FOR SURGERY INSTRUCTION SHEET-     DO NOT WEAR JEWELRY, LOTIONS, POWDERS, OR PERFUMES.  WOMEN-- DO NOT SHAVE LEGS OR UNDERARMS FOR 12 HOURS BEFORE SHOWERS. MEN MAY SHAVE FACE.  Patients discharged the day of surgery will not be allowed to drive home. IF going home the day of surgery, you must have a driver and someone to stay with you for the first 24 hours  Name and phone number of your driver:    admission                                                                    Please read over the following fact sheets that you were given: MRSA Information, Incentive Spirometry Sheet, Blood Transfusion Sheet  Information                                                                                   [name]  PST 336  [phone]                 FAILURE TO FOLLOW THESE INSTRUCTIONS MAY RESULT IN  CANCELLATION   OF YOUR SURGERY                                                  Patient Signature _____________________________

## 2012-11-10 ENCOUNTER — Encounter (HOSPITAL_COMMUNITY)
Admission: RE | Admit: 2012-11-10 | Discharge: 2012-11-10 | Disposition: A | Discharge status: Home / Self Care | Payer: Medicare Other | Source: Ambulatory Visit | Attending: General Surgery | Admitting: General Surgery

## 2012-11-10 ENCOUNTER — Encounter (HOSPITAL_COMMUNITY): Payer: Self-pay

## 2012-11-10 HISTORY — DX: Other specified postprocedural states: Z98.890

## 2012-11-10 HISTORY — DX: Adverse effect of unspecified anesthetic, initial encounter: T41.45XA

## 2012-11-10 HISTORY — DX: Nausea with vomiting, unspecified: R11.2

## 2012-11-10 HISTORY — DX: Chronic obstructive pulmonary disease, unspecified: J44.9

## 2012-11-10 HISTORY — DX: Mental disorder, not otherwise specified: F99

## 2012-11-10 LAB — BASIC METABOLIC PANEL
BUN: 13 mg/dL (ref 6–23)
CO2: 26 mEq/L (ref 19–32)
Chloride: 101 mEq/L (ref 96–112)
Creatinine, Ser: 0.59 mg/dL (ref 0.50–1.10)

## 2012-11-10 LAB — CBC WITH DIFFERENTIAL/PLATELET
Basophils Absolute: 0 10*3/uL (ref 0.0–0.1)
Basophils Relative: 0 % (ref 0–1)
Eosinophils Relative: 2 % (ref 0–5)
HCT: 41.1 % (ref 36.0–46.0)
Lymphocytes Relative: 32 % (ref 12–46)
MCHC: 33.3 g/dL (ref 30.0–36.0)
MCV: 96.9 fL (ref 78.0–100.0)
Monocytes Absolute: 0.8 10*3/uL (ref 0.1–1.0)
Monocytes Relative: 9 % (ref 3–12)
RDW: 12.6 % (ref 11.5–15.5)

## 2012-11-14 ENCOUNTER — Encounter (HOSPITAL_COMMUNITY): Payer: Self-pay | Admitting: Anesthesiology

## 2012-11-14 ENCOUNTER — Ambulatory Visit (HOSPITAL_COMMUNITY): Payer: Medicare Other | Admitting: Anesthesiology

## 2012-11-14 ENCOUNTER — Encounter (HOSPITAL_COMMUNITY): Payer: Self-pay | Admitting: *Deleted

## 2012-11-14 ENCOUNTER — Encounter (HOSPITAL_COMMUNITY)
Admission: RE | Disposition: A | Discharge status: Home / Self Care | Payer: Self-pay | Source: Ambulatory Visit | Attending: General Surgery

## 2012-11-14 ENCOUNTER — Ambulatory Visit (HOSPITAL_COMMUNITY)
Admission: RE | Admit: 2012-11-14 | Discharge: 2012-11-14 | Disposition: A | Discharge status: Home / Self Care | Payer: Medicare Other | Source: Ambulatory Visit | Attending: General Surgery | Admitting: General Surgery

## 2012-11-14 DIAGNOSIS — R935 Abnormal findings on diagnostic imaging of other abdominal regions, including retroperitoneum: Secondary | ICD-10-CM | POA: Insufficient documentation

## 2012-11-14 DIAGNOSIS — R1032 Left lower quadrant pain: Secondary | ICD-10-CM

## 2012-11-14 DIAGNOSIS — M6200 Separation of muscle (nontraumatic), unspecified site: Secondary | ICD-10-CM | POA: Insufficient documentation

## 2012-11-14 DIAGNOSIS — E039 Hypothyroidism, unspecified: Secondary | ICD-10-CM | POA: Insufficient documentation

## 2012-11-14 DIAGNOSIS — K219 Gastro-esophageal reflux disease without esophagitis: Secondary | ICD-10-CM | POA: Insufficient documentation

## 2012-11-14 DIAGNOSIS — J449 Chronic obstructive pulmonary disease, unspecified: Secondary | ICD-10-CM | POA: Insufficient documentation

## 2012-11-14 DIAGNOSIS — J4489 Other specified chronic obstructive pulmonary disease: Secondary | ICD-10-CM | POA: Insufficient documentation

## 2012-11-14 DIAGNOSIS — Z853 Personal history of malignant neoplasm of breast: Secondary | ICD-10-CM | POA: Insufficient documentation

## 2012-11-14 DIAGNOSIS — Z01812 Encounter for preprocedural laboratory examination: Secondary | ICD-10-CM | POA: Insufficient documentation

## 2012-11-14 DIAGNOSIS — I10 Essential (primary) hypertension: Secondary | ICD-10-CM | POA: Insufficient documentation

## 2012-11-14 HISTORY — PX: LAPAROSCOPY: SHX197

## 2012-11-14 SURGERY — LAPAROSCOPY, DIAGNOSTIC
Anesthesia: General | Site: Abdomen | Wound class: Clean

## 2012-11-14 MED ORDER — ACETAMINOPHEN 10 MG/ML IV SOLN
INTRAVENOUS | Status: DC | PRN
  Administered 2012-11-14: 1000 mg via INTRAVENOUS

## 2012-11-14 MED ORDER — ACETAMINOPHEN 10 MG/ML IV SOLN: 1000.0000 mg | Freq: Once | INTRAVENOUS | Status: DC | PRN

## 2012-11-14 MED ORDER — KETOROLAC TROMETHAMINE 30 MG/ML IJ SOLN
INTRAMUSCULAR | Status: DC | PRN
  Administered 2012-11-14: 30 mg via INTRAVENOUS

## 2012-11-14 MED ORDER — LACTATED RINGERS IV SOLN
INTRAVENOUS | Status: DC | PRN
  Administered 2012-11-14 (×2): via INTRAVENOUS

## 2012-11-14 MED ORDER — MEPERIDINE HCL 50 MG/ML IJ SOLN: 6.2500 mg | INTRAMUSCULAR | Status: DC | PRN

## 2012-11-14 MED ORDER — MIDAZOLAM HCL 5 MG/5ML IJ SOLN
INTRAMUSCULAR | Status: DC | PRN
  Administered 2012-11-14: 1 mg via INTRAVENOUS

## 2012-11-14 MED ORDER — PROMETHAZINE HCL 25 MG/ML IJ SOLN: 6.2500 mg | INTRAMUSCULAR | Status: DC | PRN

## 2012-11-14 MED ORDER — NEOSTIGMINE METHYLSULFATE 1 MG/ML IJ SOLN
INTRAMUSCULAR | Status: DC | PRN
  Administered 2012-11-14: 4 mg via INTRAVENOUS

## 2012-11-14 MED ORDER — DEXAMETHASONE SODIUM PHOSPHATE 10 MG/ML IJ SOLN
INTRAMUSCULAR | Status: DC | PRN
  Administered 2012-11-14: 10 mg via INTRAVENOUS

## 2012-11-14 MED ORDER — CISATRACURIUM BESYLATE (PF) 10 MG/5ML IV SOLN
INTRAVENOUS | Status: DC | PRN
  Administered 2012-11-14: 4 mg via INTRAVENOUS

## 2012-11-14 MED ORDER — OXYCODONE-ACETAMINOPHEN 5-325 MG PO TABS: 1.0000 | ORAL_TABLET | ORAL | Status: DC | PRN

## 2012-11-14 MED ORDER — SUFENTANIL CITRATE 50 MCG/ML IV SOLN
INTRAVENOUS | Status: DC | PRN
  Administered 2012-11-14: 20 ug via INTRAVENOUS

## 2012-11-14 MED ORDER — LIDOCAINE HCL (CARDIAC) 20 MG/ML IV SOLN
INTRAVENOUS | Status: DC | PRN
  Administered 2012-11-14: 100 mg via INTRAVENOUS

## 2012-11-14 MED ORDER — LACTATED RINGERS IV SOLN: INTRAVENOUS | Status: DC

## 2012-11-14 MED ORDER — HEPARIN SODIUM (PORCINE) 5000 UNIT/ML IJ SOLN
5000.0000 [IU] | Freq: Once | INTRAMUSCULAR | Status: AC
  Administered 2012-11-14: 5000 [IU] via SUBCUTANEOUS
  Filled 2012-11-14: qty 1

## 2012-11-14 MED ORDER — SUCCINYLCHOLINE CHLORIDE 20 MG/ML IJ SOLN
INTRAMUSCULAR | Status: DC | PRN
  Administered 2012-11-14: 80 mg via INTRAVENOUS

## 2012-11-14 MED ORDER — GLYCOPYRROLATE 0.2 MG/ML IJ SOLN
INTRAMUSCULAR | Status: DC | PRN
  Administered 2012-11-14: .6 mg via INTRAVENOUS

## 2012-11-14 MED ORDER — BUPIVACAINE-EPINEPHRINE 0.25% -1:200000 IJ SOLN
INTRAMUSCULAR | Status: DC | PRN
  Administered 2012-11-14: 19 mL

## 2012-11-14 MED ORDER — ACETAMINOPHEN 10 MG/ML IV SOLN
INTRAVENOUS | Status: AC
  Filled 2012-11-14: qty 100

## 2012-11-14 MED ORDER — CEFAZOLIN SODIUM-DEXTROSE 2-3 GM-% IV SOLR
INTRAVENOUS | Status: AC
  Filled 2012-11-14: qty 50

## 2012-11-14 MED ORDER — BUPIVACAINE HCL 0.25 % IJ SOLN
INTRAMUSCULAR | Status: AC
  Filled 2012-11-14: qty 1

## 2012-11-14 MED ORDER — HYDROMORPHONE HCL PF 1 MG/ML IJ SOLN: 0.2500 mg | INTRAMUSCULAR | Status: DC | PRN

## 2012-11-14 MED ORDER — PROPOFOL 10 MG/ML IV BOLUS
INTRAVENOUS | Status: DC | PRN
  Administered 2012-11-14: 150 mg via INTRAVENOUS

## 2012-11-14 MED ORDER — OXYCODONE HCL 5 MG/5ML PO SOLN
5.0000 mg | Freq: Once | ORAL | Status: DC | PRN
  Filled 2012-11-14: qty 5

## 2012-11-14 MED ORDER — CEFAZOLIN SODIUM-DEXTROSE 2-3 GM-% IV SOLR
2.0000 g | INTRAVENOUS | Status: AC
  Administered 2012-11-14: 2 g via INTRAVENOUS

## 2012-11-14 MED ORDER — 0.9 % SODIUM CHLORIDE (POUR BTL) OPTIME
TOPICAL | Status: DC | PRN
  Administered 2012-11-14: 1000 mL

## 2012-11-14 MED ORDER — ONDANSETRON HCL 4 MG/2ML IJ SOLN
INTRAMUSCULAR | Status: DC | PRN
  Administered 2012-11-14: 4 mg via INTRAVENOUS

## 2012-11-14 MED ORDER — OXYCODONE HCL 5 MG PO TABS: 5.0000 mg | ORAL_TABLET | Freq: Once | ORAL | Status: DC | PRN

## 2012-11-14 MED ORDER — BUPIVACAINE-EPINEPHRINE PF 0.25-1:200000 % IJ SOLN
INTRAMUSCULAR | Status: AC
  Filled 2012-11-14: qty 30

## 2012-11-14 SURGICAL SUPPLY — 78 items
APPLIER CLIP 5 13 M/L LIGAMAX5 (MISCELLANEOUS)
APPLIER CLIP ROT 10 11.4 M/L (STAPLE)
BINDER ABD UNIV 12 45-62 (WOUND CARE) IMPLANT
BINDER ABDOMINAL 46IN 62IN (WOUND CARE)
BLADE EXTENDED COATED 6.5IN (ELECTRODE) IMPLANT
BLADE HEX COATED 2.75 (ELECTRODE) IMPLANT
BLADE SURG ROTATE 9660 (MISCELLANEOUS) ×3 IMPLANT
BLADE SURG SZ10 CARB STEEL (BLADE) IMPLANT
BLADE SURG SZ11 CARB STEEL (BLADE) ×3 IMPLANT
CABLE HIGH FREQUENCY MONO STRZ (ELECTRODE) ×3 IMPLANT
CANISTER SUCTION 2500CC (MISCELLANEOUS) ×3 IMPLANT
CANNULA ENDOPATH XCEL 11M (ENDOMECHANICALS) IMPLANT
CLIP APPLIE 5 13 M/L LIGAMAX5 (MISCELLANEOUS) IMPLANT
CLIP APPLIE ROT 10 11.4 M/L (STAPLE) IMPLANT
CLOTH BEACON ORANGE TIMEOUT ST (SAFETY) ×3 IMPLANT
COVER MAYO STAND STRL (DRAPES) ×3 IMPLANT
DECANTER SPIKE VIAL GLASS SM (MISCELLANEOUS) ×3 IMPLANT
DERMABOND ADVANCED (GAUZE/BANDAGES/DRESSINGS) ×1
DERMABOND ADVANCED .7 DNX12 (GAUZE/BANDAGES/DRESSINGS) ×2 IMPLANT
DEVICE SECURE STRAP 25 ABSORB (INSTRUMENTS) IMPLANT
DEVICE TROCAR PUNCTURE CLOSURE (ENDOMECHANICALS) IMPLANT
DRAPE INCISE IOBAN 66X45 STRL (DRAPES) ×3 IMPLANT
DRAPE LAPAROSCOPIC ABDOMINAL (DRAPES) ×3 IMPLANT
DRAPE UTILITY XL STRL (DRAPES) ×3 IMPLANT
DRAPE WARM FLUID 44X44 (DRAPE) IMPLANT
ELECT REM PT RETURN 9FT ADLT (ELECTROSURGICAL) ×3
ELECTRODE REM PT RTRN 9FT ADLT (ELECTROSURGICAL) ×2 IMPLANT
FILTER SMOKE EVAC LAPAROSHD (FILTER) IMPLANT
GLOVE BIO SURGEON STRL SZ7 (GLOVE) ×3 IMPLANT
GLOVE BIOGEL M 7.0 STRL (GLOVE) ×3 IMPLANT
GLOVE BIOGEL PI IND STRL 7.0 (GLOVE) ×2 IMPLANT
GLOVE BIOGEL PI IND STRL 7.5 (GLOVE) ×2 IMPLANT
GLOVE BIOGEL PI INDICATOR 7.0 (GLOVE) ×1
GLOVE BIOGEL PI INDICATOR 7.5 (GLOVE) ×1
GOWN PREVENTION PLUS LG XLONG (DISPOSABLE) ×3 IMPLANT
GOWN PREVENTION PLUS XLARGE (GOWN DISPOSABLE) ×3 IMPLANT
GOWN STRL NON-REIN LRG LVL3 (GOWN DISPOSABLE) ×3 IMPLANT
GOWN STRL REIN XL XLG (GOWN DISPOSABLE) ×3 IMPLANT
KIT BASIN OR (CUSTOM PROCEDURE TRAY) ×3 IMPLANT
LIGASURE IMPACT 36 18CM CVD LR (INSTRUMENTS) IMPLANT
MARKER SKIN DUAL TIP RULER LAB (MISCELLANEOUS) ×3 IMPLANT
NEEDLE SPNL 22GX3.5 QUINCKE BK (NEEDLE) ×3 IMPLANT
NS IRRIG 1000ML POUR BTL (IV SOLUTION) ×3 IMPLANT
PENCIL BUTTON HOLSTER BLD 10FT (ELECTRODE) IMPLANT
SCALPEL HARMONIC ACE (MISCELLANEOUS) IMPLANT
SCISSORS LAP 5X35 DISP (ENDOMECHANICALS) ×3 IMPLANT
SET IRRIG TUBING LAPAROSCOPIC (IRRIGATION / IRRIGATOR) IMPLANT
SOLUTION ANTI FOG 6CC (MISCELLANEOUS) ×3 IMPLANT
SPONGE GAUZE 4X4 12PLY (GAUZE/BANDAGES/DRESSINGS) ×3 IMPLANT
SPONGE LAP 18X18 X RAY DECT (DISPOSABLE) IMPLANT
STAPLER VISISTAT 35W (STAPLE) ×3 IMPLANT
STRIP CLOSURE SKIN 1/2X4 (GAUZE/BANDAGES/DRESSINGS) IMPLANT
SUCTION POOLE TIP (SUCTIONS) IMPLANT
SUT MNCRL AB 4-0 PS2 18 (SUTURE) ×3 IMPLANT
SUT PDS AB 1 TP1 96 (SUTURE) IMPLANT
SUT PROLENE 0 CT 1 CR/8 (SUTURE) IMPLANT
SUT PROLENE 2 0 KS (SUTURE) IMPLANT
SUT PROLENE 2 0 SH DA (SUTURE) IMPLANT
SUT SILK 2 0 (SUTURE)
SUT SILK 2 0 SH CR/8 (SUTURE) IMPLANT
SUT SILK 2-0 18XBRD TIE 12 (SUTURE) IMPLANT
SUT SILK 3 0 (SUTURE)
SUT SILK 3 0 SH CR/8 (SUTURE) IMPLANT
SUT SILK 3-0 18XBRD TIE 12 (SUTURE) IMPLANT
SYS LAPSCP GELPORT 120MM (MISCELLANEOUS)
SYSTEM LAPSCP GELPORT 120MM (MISCELLANEOUS) IMPLANT
TACKER 5MM HERNIA 3.5CML NAB (ENDOMECHANICALS) ×3 IMPLANT
TOWEL OR 17X26 10 PK STRL BLUE (TOWEL DISPOSABLE) ×3 IMPLANT
TRAY FOLEY CATH 14FRSI W/METER (CATHETERS) ×3 IMPLANT
TRAY LAP CHOLE (CUSTOM PROCEDURE TRAY) ×3 IMPLANT
TROCAR BALLN 12MMX100 BLUNT (TROCAR) IMPLANT
TROCAR BLADELESS OPT 5 75 (ENDOMECHANICALS) ×6 IMPLANT
TROCAR XCEL 12X100 BLDLESS (ENDOMECHANICALS) IMPLANT
TROCAR XCEL BLUNT TIP 100MML (ENDOMECHANICALS) IMPLANT
TROCAR XCEL NON-BLD 11X100MML (ENDOMECHANICALS) ×3 IMPLANT
TUBING INSUFFLATION 10FT LAP (TUBING) ×3 IMPLANT
YANKAUER SUCT BULB TIP 10FT TU (MISCELLANEOUS) IMPLANT
YANKAUER SUCT BULB TIP NO VENT (SUCTIONS) IMPLANT

## 2012-11-14 NOTE — Op Note (Signed)
Preoperative diagnosis: Possible left lower quadrant incisional hernia Postoperative diagnosis: Diastasis, and no hernia Procedure: Diagnostic laparoscopy Surgeon: Dr. Harden Mo Anesthesia: Gen. Endotracheal Estimated blood loss: Minimal Complications: None Specimens: None Sponge and needle count correct at end of operation Drains: None Disposition to recovery stable  Indications: This is a 67 year old female who I know well from treatment for breast cancer. She had a TRAM flap for breast cancer. She has had pain in the left lower quadrant of her incision for some time now. She is also on a number of other issues that have gone on as well. I obtained a CT scan last July that showed a possible left lower quadrant hernia but I thought that this was just a weakness and that there was abdominal wall overlying this. She did not have a bulge on her exam that but did have some focal pain in her left lower quadrant. She had some shortness of breath issues that were worked out since then and I saw her again in December. We decided at that point to do a diagnostic laparoscopy to see if there was a hernia there. We discussed hernia repair laparoscopically if there was something I could identify.  Procedure: After informed consent was obtained the patient was taken to the operating room. She was placed under general anesthesia. She was give 2 g of intravenous cefazolin. She had sequential compression devices on her legs. She had a Foley catheter placed. She had an orogastric tube in place. She was then prepped and draped in the standard sterile surgical fashion. A surgical timeout was performed.  I infiltrated Marcaine in the left upper quadrant and made an incision and then entered into the abdomen with a 5 mm trocar using Optiview technique. These abdomen was insufflated to 15 mmHg pressure. There was no evidence of an entry injury. The prior epigastric mesh I had placed was in good position. There was  some scar tissue associated with this. I then placed her in the Trendelenburg position and evaluated the lower abdomen. She complained of some right-sided pain recently as well. I viewed the entire lower abdomen. There was no evidence of a hernia. There was some weakness as well as a diastasis as this is partially surgically created. But there was no evidence of a hernia. I did not think that placing a piece of mesh for her primary complaint of pain would help any of that and indeed would worsen. I asked my partner Dr. Andrey Campanile to look as well and he concurred with that. I took pictures of this while I was there. I again did decided not to fix anything because I was unable to identify a hernia. I then removed the trocars and desufflated the abdomen. I closed these with 4 Monocryl and Dermabond. She was extubated and transferred to recovery stable.  I discussed with her husband postoperatively that the next step for some focal pain may be some injection therapy.

## 2012-11-14 NOTE — Anesthesia Procedure Notes (Signed)
Procedure Name: Intubation Date/Time: 11/14/2012 7:46 AM Performed by: Leroy Libman L Patient Re-evaluated:Patient Re-evaluated prior to inductionOxygen Delivery Method: Circle system utilized Preoxygenation: Pre-oxygenation with 100% oxygen Intubation Type: IV induction Ventilation: Mask ventilation without difficulty and Oral airway inserted - appropriate to patient size Laryngoscope Size: Hyacinth Meeker and 2 Grade View: Grade II Tube type: Oral Tube size: 7.5 mm Number of attempts: 2 Airway Equipment and Method: Stylet Placement Confirmation: ETT inserted through vocal cords under direct vision,  positive ETCO2 and breath sounds checked- equal and bilateral Secured at: 21 cm Tube secured with: Tape Dental Injury: Teeth and Oropharynx as per pre-operative assessment

## 2012-11-14 NOTE — Anesthesia Preprocedure Evaluation (Addendum)
Anesthesia Evaluation  Patient identified by MRN, date of birth, ID band Patient awake    Reviewed: Allergy & Precautions, H&P , NPO status , Patient's Chart, lab work & pertinent test results  History of Anesthesia Complications (+) PONV  Airway Mallampati: II TM Distance: >3 FB Neck ROM: Full    Dental  (+) Dental Advisory Given and Teeth Intact   Pulmonary shortness of breath and with exertion, COPD COPD inhaler,  breath sounds clear to auscultation  Pulmonary exam normal       Cardiovascular hypertension, Pt. on medications + DOE Rhythm:Regular Rate:Normal     Neuro/Psych PSYCHIATRIC DISORDERS Depression TIAnegative neurological ROS  negative psych ROS   GI/Hepatic Neg liver ROS, GERD-  Medicated,  Endo/Other  Hypothyroidism   Renal/GU negative Renal ROS     Musculoskeletal negative musculoskeletal ROS (+)   Abdominal (+) + obese,   Peds  Hematology negative hematology ROS (+)   Anesthesia Other Findings   Reproductive/Obstetrics                          Anesthesia Physical Anesthesia Plan  ASA: III  Anesthesia Plan: General   Post-op Pain Management:    Induction: Intravenous  Airway Management Planned: Oral ETT  Additional Equipment:   Intra-op Plan:   Post-operative Plan: Extubation in OR  Informed Consent: I have reviewed the patients History and Physical, chart, labs and discussed the procedure including the risks, benefits and alternatives for the proposed anesthesia with the patient or authorized representative who has indicated his/her understanding and acceptance.   Dental advisory given  Plan Discussed with: CRNA  Anesthesia Plan Comments:         Anesthesia Quick Evaluation

## 2012-11-14 NOTE — Preoperative (Signed)
Beta Blockers   Reason not to administer Beta Blockers:Not Applicable 

## 2012-11-14 NOTE — H&P (Signed)
This is a 66 year old female with a history of a stage IIA right breast cancer treated with a right mastectomy, sentinel node biopsy and a TRAM flap. She's also undergone implant placement as well as a mastopexy on the left side. She had a mammogram today that shows no evidence of any abnormality and is recommended for a routine screening in one year. She had a repeat ultrasound of a focal area of pain in the lower inner quadrant of her left breast which shows no focal discrete cystic or solid lesions. She states that this area still intermittently tender but were otherwise reports no complaints referable to either breast right now. Since I last saw her she had seen a pulmonologist about her shortness of breath. She has since been placed on Spiriva which really has resolved all of her symptoms and she feels back to her normal self.  She still complains of some focal tenderness in the left lower quadrant at the site of her incision. This area still causing her discomfort. There is not really a bulge that is associated with it at all. I examined this area before and I thought that there might be a hernia as well as possibly seen on the CT scan. We discussed the laparoscopy before and were just waiting to get a shortness of breath evaluated.   Review of Systems  Constitutional: Negative for fever, chills and unexpected weight change.  HENT: Negative for congestion, sore throat, trouble swallowing and voice change.  Respiratory: Negative for cough, shortness of breath and wheezing.  Cardiovascular: Negative for chest pain, palpitations and leg swelling.  Gastrointestinal: Positive for abdominal pain. Negative for nausea, vomiting, diarrhea, constipation, blood in stool, abdominal distention and anal bleeding.  Genitourinary: Negative for hematuria, vaginal bleeding and difficulty urinating.  Musculoskeletal: Negative for arthralgias.  Skin: Negative for rash and wound.  Neurological: Negative for seizures,  syncope and headaches.  Hematological: Negative for adenopathy. Does not bruise/bleed easily.  Psychiatric/Behavioral: Negative for confusion.   DIGITAL DIAGNOSTIC BILATERAL MAMMOGRAM WITH CAD AND LEFT BREAST  ULTRASOUND:  Comparison: August 07, 2011, July 29, 2010, May 26, 2010  Findings:  ACR Breast Density Category scattered fibroglandular tissue.  CC and MLO views of the left breast, [place] CC and MLO views of  the left breast, right MLO view of the TRAM flap are submitted. No  suspicious abnormality is identified bilaterally. Stable  postsurgical changes identified within the right TRAM flap.  Mammographic images were processed with CAD.  Ultrasound is performed, showing no focal discrete cystic or solid  lesion in the focal area pain lower inner quadrant left breast.  IMPRESSION:  Benign findings.  RECOMMENDATION:  Routine screening mammogram in 1 year.  I have discussed the findings and recommendations with the patient.  Results were also provided in writing at the conclusion of the  visit.   Objective:   Physical Exam  Vitals reviewed.  Constitutional: She appears well-developed and well-nourished.  Neck: Neck supple.  Pulmonary/Chest: Right breast exhibits no mass, no skin change and no tenderness. Left breast exhibits tenderness (lower inner quadrant but no mass). Left breast exhibits no inverted nipple, no mass, no nipple discharge and no skin change.   Abdominal: Soft. Bowel sounds are normal. She exhibits no distension. There is tenderness. A hernia (possible hernia llq near incision, point tenderness) is present.    Assessment:    History breast cancer  Possible abdominal wall hernia   Plan:    She has no evidence of recurrence of  her breast cancer at all. She is to continue with regular screening.  Her shortness of breath is now resolved. I think it is reasonable to consider trying to take care of I believe there is a likely hernia. I discussed with her  today a diagnostic laparoscopy to see if there truly is a hernia at this site. I told her if there is a hernia present and we will plan on doing a laparoscopic hernia repair with mesh. We discussed the conduct of the operation as well as the postoperative course both in the hospital and out of the hospital. I told her that it be about 2-5 days in the hospital. I told her this would be about 3-4 weeks of decreased activity as well as about 2 or 3 months of her coverage. We discussed the risks including bleeding, infection, recurrence, chronic pain as well. I think is reasonable given his CT scan and her continued symptoms to proceed with this.

## 2012-11-14 NOTE — Anesthesia Postprocedure Evaluation (Signed)
Anesthesia Post Note  Patient: Shannon [name]  Procedure(s) Performed: Procedure(s) (LRB): LAPAROSCOPY DIAGNOSTIC (N/A)  Anesthesia type: General  Patient location: PACU  Post pain: Pain level controlled  Post assessment: Post-op Vital signs reviewed  Last Vitals: BP 136/76  Pulse 64  Temp 36.6 C  Resp 12  SpO2 97%  Post vital signs: Reviewed  Level of consciousness: sedated  Complications: No apparent anesthesia complications

## 2012-11-14 NOTE — Transfer of Care (Signed)
Immediate Anesthesia Transfer of Care Note  Patient: Shannon [name]  Procedure(s) Performed: Procedure(s) (LRB) with comments: LAPAROSCOPY DIAGNOSTIC (N/A)  Patient Location: PACU  Anesthesia Type:General  Level of Consciousness: awake, alert  and oriented  Airway & Oxygen Therapy: Patient Spontanous Breathing and Patient connected to face mask oxygen  Post-op Assessment: Report given to PACU RN and Post -op Vital signs reviewed and stable  Post vital signs: Reviewed and stable  Complications: No apparent anesthesia complications

## 2012-11-15 ENCOUNTER — Encounter (HOSPITAL_COMMUNITY): Payer: Self-pay | Admitting: General Surgery

## 2012-12-02 ENCOUNTER — Encounter (INDEPENDENT_AMBULATORY_CARE_PROVIDER_SITE_OTHER): Payer: Medicare Other | Admitting: General Surgery

## 2012-12-06 ENCOUNTER — Ambulatory Visit (INDEPENDENT_AMBULATORY_CARE_PROVIDER_SITE_OTHER): Payer: Medicare Other | Admitting: General Surgery

## 2012-12-06 ENCOUNTER — Encounter (INDEPENDENT_AMBULATORY_CARE_PROVIDER_SITE_OTHER): Payer: Self-pay | Admitting: General Surgery

## 2012-12-06 VITALS — BP 146/90 | HR 76 | Temp 97.9°F | Resp 18 | Ht 63.0 in | Wt 183.1 lb

## 2012-12-06 DIAGNOSIS — Z09 Encounter for follow-up examination after completed treatment for conditions other than malignant neoplasm: Secondary | ICD-10-CM

## 2012-12-06 NOTE — Progress Notes (Signed)
Subjective:     Patient ID: Shannon [name], female   DOB: 20-Apr-1946, 67 y.o.   MRN: [id number]  HPI This is a 67 year old female underwent for treatment of her breast cancer as well as the repair of a prior epigastric hernia. She underwent a TRAM flap reconstruction. She's had a lot of pain in her left lower quadrant since then. She had a CT scan that showed a possible hernia although I thought on my examination I did not feel a hernia. Due to the fact that this pain continued I discussed with her going to the operating room for a diagnostic laparoscopy and repair of a hernia if this was indicated at the same time. I took her to the operating room recently and did a diagnostic laparoscopy without complication. There was no evidence of a hernia at this site at all. She was discharged on the same day. She returns today doing well from that surgery without any complaints. We discussed the results today. She also is complaining today of her right breast is larger and she would like to talk with Dr. Odis Luster about reduction on this side now.  Review of Systems     Objective:   Physical Exam Well healed incisions without infection    Assessment:     S/p dx lsc     Plan:     I told her she can return were full activity. I will plan on seeing her in 6 months for followup for breast cancer. I encouraged her to go back and see Dr. Odis Luster for a conversation as well.

## 2012-12-15 ENCOUNTER — Other Ambulatory Visit: Payer: Self-pay | Admitting: *Deleted

## 2012-12-15 MED ORDER — CELECOXIB 200 MG PO CAPS: 200.0000 mg | ORAL_CAPSULE | Freq: Every day | ORAL | Status: DC

## 2012-12-15 NOTE — Telephone Encounter (Signed)
Last filled 10/31/2012

## 2012-12-19 ENCOUNTER — Encounter: Payer: Self-pay | Admitting: Family Medicine

## 2012-12-19 ENCOUNTER — Ambulatory Visit (INDEPENDENT_AMBULATORY_CARE_PROVIDER_SITE_OTHER)
Admission: RE | Admit: 2012-12-19 | Discharge: 2012-12-19 | Disposition: A | Discharge status: Home / Self Care | Payer: Medicare Other | Source: Ambulatory Visit | Attending: Family Medicine | Admitting: Family Medicine

## 2012-12-19 ENCOUNTER — Ambulatory Visit (INDEPENDENT_AMBULATORY_CARE_PROVIDER_SITE_OTHER): Payer: Medicare Other | Admitting: Family Medicine

## 2012-12-19 VITALS — BP 138/92 | HR 82 | Temp 98.0°F | Wt 183.0 lb

## 2012-12-19 DIAGNOSIS — IMO0002 Reserved for concepts with insufficient information to code with codable children: Secondary | ICD-10-CM

## 2012-12-19 DIAGNOSIS — M5410 Radiculopathy, site unspecified: Secondary | ICD-10-CM

## 2012-12-19 DIAGNOSIS — M5416 Radiculopathy, lumbar region: Secondary | ICD-10-CM

## 2012-12-19 MED ORDER — CELECOXIB 200 MG PO CAPS: 200.0000 mg | ORAL_CAPSULE | Freq: Every day | ORAL | Status: DC

## 2012-12-19 MED ORDER — DEXAMETHASONE SOD PHOSPHATE PF 10 MG/ML IJ SOLN
10.0000 mg | Freq: Once | INTRAMUSCULAR | Status: AC
  Administered 2012-12-19: 10 mg via INTRAMUSCULAR

## 2012-12-19 NOTE — Progress Notes (Signed)
Subjective:    Patient ID: Shannon [name], female    DOB: 1946-06-30, 67 y.o.   MRN: [id number]  HPI  Shannon [name], a 67 y.o. female presents today in the office for the following:    67 yo very pleasant female with h/o bulging disks here for acute onset of back pain with radiculopathy.  Two weeks ago, was in [place] for her grand daughter's volley ball championship.  She was walking a lot over the entire weekend. When she returned home, severe low back pain with shooting pain bilaterally down both lower extremities to her feet.  Pain is worse with sitting, standing or walking.  An MR of the lumbar spine from 2010 and was reviewed and the patient does have a few small herniations as well as a spondyloarthropathy. She has a remote history of Reiter's syndrome.  She does use celebrex with some relief of symptoms.    No bowel or bladder incontinence.   Patient Active Problem List  Diagnosis  . HYPOTHYROIDISM  . HYPERLIPIDEMIA  . ANEMIA-NOS  . TRANSIENT ISCHEMIC ATTACK  . CERVICAL RADICULOPATHY  . PARESTHESIA  . BREAST CANCER, HX OF  . DIVERTICULITIS, HX OF  . REFLEX SYMPATHETIC DYSTROPHY  . Peripheral neuropathy  . Lumbar radiculopathy  . Depression  . Elevated BP  . Hypertension  . DOE (dyspnea on exertion)  . COPD (chronic obstructive pulmonary disease)  . Fatigue   Past Medical History  Diagnosis Date  . Thyroid disease     HYPO  . Restless leg syndrome   . Hx: UTI (urinary tract infection)   . Diverticulitis   . Anemia   . Reiter's disease     DIAGNOSED YEARS AGO IN [place] BY RHEUMATOLOGY  . HLD (hyperlipidemia)   . GERD (gastroesophageal reflux disease)   . Neuromuscular disorder   . Hearing loss   . Nasal congestion   . Abdominal pain   . 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 Hypertension   . Complication of anesthesia 1977    states stops breathing post op requring  "step down unit" care x 4 days  . PONV (postoperative nausea and  vomiting) 2010  . Mental disorder     "[facility] after thyroidectomy"  . COPD (chronic obstructive pulmonary disease)   . Stroke 2010    TIA   Past Surgical History  Procedure Laterality Date  . Ovary surgery  2006  . Abdominal hysterectomy  1976  . Cholecystectomy  2008  . Hernia repair  2010  . Tram      right tram flap  . Breast surgery  2009    right mastectomy sent node biopsy  . Breast reconstruction      right tram, left implant and lift  . Eye surgery      RK Bilateral  . Laparoscopy  11/14/2012    Procedure: LAPAROSCOPY DIAGNOSTIC;  Surgeon: Emelia Loron, MD;  Location: [facility];  Service: General;  Laterality: N/A;   History  Substance Use Topics  . Smoking status: Former Smoker -- 1.50 packs/day for 20 years    Types: Cigarettes    Quit date: 11/08/2003  . Smokeless tobacco: Never Used  . Alcohol Use: No     Comment: rarely   Family History  Problem Relation Age of Onset  . Lung cancer Mother     was a smoker  . Lung cancer Maternal Grandmother     never a smoker  . Emphysema Father  was a former smoker   No Known Allergies Current Outpatient Prescriptions on File Prior to Visit  Medication Sig Dispense Refill  . aspirin 81 MG tablet Take 81 mg by mouth daily.       Marland Kitchen co-enzyme Q-10 30 MG capsule Take 30 mg by mouth daily.       . cyclobenzaprine (FLEXERIL) 10 MG tablet Take 10 mg by mouth daily as needed. Muscle spasms      . hydrochlorothiazide (MICROZIDE) 12.5 MG capsule Take 12.5 mg by mouth daily before breakfast.      . levothyroxine (SYNTHROID, LEVOTHROID) 100 MCG tablet Take 100 mcg by mouth every evening.       Marland Kitchen OVER THE COUNTER MEDICATION Vitamin B complex daily po-   ladt dose 11/11/12      . sertraline (ZOLOFT) 50 MG tablet Take 50 mg by mouth daily before breakfast.      . tiotropium (SPIRIVA) 18 MCG inhalation capsule Place 1 capsule (18 mcg total) into inhaler and inhale daily.  30 capsule  6   No current facility-administered  medications on file prior to visit.    The PMH, PSH, Social History, Family History, Medications, and allergies have been reviewed in [facility], and have been updated if relevant.   Review of Systems Multiple c/o, multiple organ systems. Arthralgias, myalgias, anterior chest wall pain, breast pain, neuropathic sensations diffusely. She also has a breast lump and an upcoming mammogram.    Objective:   Physical Exam  Blood pressure 138/92, pulse 82, temperature 98 F (36.7 C), weight 183 lb (83.008 kg).  Gen: Well-developed,well-nourished,in no acute distress; alert,appropriate and cooperative throughout examination HEENT: Normocephalic and atraumatic without obvious abnormalities.  Ears, externally no deformities Pulm: Breathing comfortably in no respiratory distress Range of motion at  the waist: Flexion: mild pain, able to flex approx 35 deg Extension: mild pain Rotation: mild pain, 40% decreased No echymosis or edema Rises to examination table with mild difficulty Straight leg raise pos bilaterally Gait: minimally antalgic     Assessment & Plan:   1. Acute low back pain with radicular symptoms, duration less than 6 weeks Consistent with nerve impingement. Decadron IM, lumbar spine film. Discussed exercises. Call or return to clinic prn if these symptoms worsen or fail to improve as anticipated.  - DG Lumbar Spine Complete; Future

## 2012-12-19 NOTE — Addendum Note (Signed)
Addended by: Eliezer Bottom on: 12/19/2012 02:11 PM   Modules accepted: Orders

## 2012-12-19 NOTE — Patient Instructions (Addendum)
Good to see you. We will call you with your xray results. Ok to take your oxycodone or tylenol for your pain for the next 3 days. After that, restart your celebrex. Please call us at the end of the week with an update of your symptoms.

## 2012-12-22 ENCOUNTER — Telehealth: Payer: Self-pay | Admitting: *Deleted

## 2012-12-22 NOTE — Telephone Encounter (Signed)
Patient has brought in form for handicapped placard.  Form completed by Dr. Dayton Martes. Placed up front for pick up, copy sent for scanning.

## 2012-12-26 ENCOUNTER — Telehealth: Payer: Self-pay

## 2012-12-26 DIAGNOSIS — M5416 Radiculopathy, lumbar region: Secondary | ICD-10-CM

## 2012-12-26 NOTE — Telephone Encounter (Signed)
Pt calling re: update on condition; back is no better; it actually seems worse. Pt wants to know if Dr Dayton Martes is going to schedule MRI.

## 2012-12-26 NOTE — Telephone Encounter (Signed)
MRI order placed.

## 2012-12-27 NOTE — Telephone Encounter (Signed)
Pt has been advised, MRI has been scheduled.

## 2012-12-29 ENCOUNTER — Ambulatory Visit
Admission: RE | Admit: 2012-12-29 | Discharge: 2012-12-29 | Disposition: A | Discharge status: Home / Self Care | Payer: Medicare Other | Source: Ambulatory Visit | Attending: Family Medicine | Admitting: Family Medicine

## 2012-12-29 DIAGNOSIS — M5416 Radiculopathy, lumbar region: Secondary | ICD-10-CM

## 2012-12-30 ENCOUNTER — Other Ambulatory Visit: Payer: Self-pay | Admitting: Family Medicine

## 2012-12-30 DIAGNOSIS — M5136 Other intervertebral disc degeneration, lumbar region: Secondary | ICD-10-CM

## 2013-01-01 ENCOUNTER — Other Ambulatory Visit: Payer: Medicare Other

## 2013-01-02 ENCOUNTER — Other Ambulatory Visit: Payer: Medicare Other

## 2013-02-13 ENCOUNTER — Ambulatory Visit: Payer: Medicare Other | Admitting: Oncology

## 2013-02-13 ENCOUNTER — Other Ambulatory Visit: Payer: Medicare Other | Admitting: Lab

## 2013-02-14 ENCOUNTER — Telehealth: Payer: Self-pay | Admitting: Oncology

## 2013-03-21 ENCOUNTER — Telehealth: Payer: Self-pay | Admitting: Pulmonary Disease

## 2013-03-21 ENCOUNTER — Other Ambulatory Visit: Payer: Self-pay | Admitting: Family Medicine

## 2013-03-21 NOTE — Telephone Encounter (Signed)
Spoke with patient, Patient states Spiriva has nothing in the capsule when she goes to take States she has opened capsules nothing is in the capsules, not even residue States her friend's doctor took them off spiriva because of same problem States she is not getting any relief on the days there is nothing in the capsule Patient wanting to switch medications. Dr. Kendrick Fries please advise, thank you!  Last OV 09/20/12 w 6 month follow up not scheduled at this time

## 2013-03-21 NOTE — Telephone Encounter (Signed)
Tell her that Spiriva is actually a good drug (the best) if she can get it into her system.  She should take the drug back to the pharmacy and have them address this.  If there is truly nothing in the pill then the company owes her a new batch.  However, I suspect that she just can't see it.

## 2013-03-21 NOTE — Telephone Encounter (Signed)
Spoke with pt's spouse and notified of recs per Dr Kendrick Fries He verbalized understanding and will inform the pt Nothing further needed

## 2013-03-22 ENCOUNTER — Telehealth: Payer: Self-pay | Admitting: *Deleted

## 2013-03-22 DIAGNOSIS — Z853 Personal history of malignant neoplasm of breast: Secondary | ICD-10-CM

## 2013-03-22 MED ORDER — ANASTROZOLE 1 MG PO TABS: 1.0000 mg | ORAL_TABLET | Freq: Every day | ORAL | Status: DC

## 2013-03-22 NOTE — Telephone Encounter (Signed)
[facility] faxed anastrozole refill request on yesterday.  Nurse sent request to provider's nurse for clarification.  Patient was to hold this medicine for six weeks and call office with an update on adverse symptoms experienced (dizziness and light headedness).  Patient did not call our office.  Missed 02-13-2013 f/u and restarted the Arimidex last month (May).  She also shared with yesterday's nurse that she will be out of town and needs to call to reschedule her 05-31-2013 f/u appt.. Called patient and left message requesting a return call.  Awaiting return call from patient to learn if she has experienced any side effects since restarted this medicine.

## 2013-03-22 NOTE — Telephone Encounter (Signed)
Patient returning call to triage nurse. Patient did take a 6week rest from Arimidex and had no improvement or worsening of symptoms. She has since restarted and has had no additional problems/complaints. She has rescheduled her appt for 05/31/13, but needs to move that appt again, as she will be out of town. Informed patient we would have scheduling call her back.

## 2013-03-27 ENCOUNTER — Telehealth: Payer: Self-pay | Admitting: Oncology

## 2013-04-26 ENCOUNTER — Other Ambulatory Visit: Payer: Self-pay | Admitting: Family Medicine

## 2013-04-28 ENCOUNTER — Telehealth: Payer: Self-pay | Admitting: Pulmonary Disease

## 2013-04-28 ENCOUNTER — Other Ambulatory Visit: Payer: Self-pay | Admitting: Pulmonary Disease

## 2013-04-28 MED ORDER — TIOTROPIUM BROMIDE MONOHYDRATE 18 MCG IN CAPS: 18.0000 ug | ORAL_CAPSULE | Freq: Every day | RESPIRATORY_TRACT | Status: DC

## 2013-04-28 NOTE — Telephone Encounter (Signed)
Called and spoke with pts husband and he is aware of refill of the spiriva that has been sent in to the pharmacy.  He will have the pt call back and schedule and appt with BQ.  He did state that pt was scheduled to have back surgery in 10 days.

## 2013-05-02 ENCOUNTER — Ambulatory Visit (INDEPENDENT_AMBULATORY_CARE_PROVIDER_SITE_OTHER): Payer: Medicare Other | Admitting: Pulmonary Disease

## 2013-05-02 ENCOUNTER — Encounter: Payer: Self-pay | Admitting: Pulmonary Disease

## 2013-05-02 VITALS — BP 124/72 | HR 62 | Temp 97.9°F | Ht 63.0 in | Wt 175.0 lb

## 2013-05-02 DIAGNOSIS — R5381 Other malaise: Secondary | ICD-10-CM

## 2013-05-02 DIAGNOSIS — J449 Chronic obstructive pulmonary disease, unspecified: Secondary | ICD-10-CM

## 2013-05-02 DIAGNOSIS — R5383 Other fatigue: Secondary | ICD-10-CM

## 2013-05-02 NOTE — Assessment & Plan Note (Signed)
I question whether or not she has sleep apnea.  Plan; -home sleep study

## 2013-05-02 NOTE — Progress Notes (Signed)
Subjective:    Patient ID: Shannon [name], female    DOB: 03/30/1946, 67 y.o.   MRN: [id number]  Synopsis: Shannon [name] was evaluated by [place] [facility] in November 2013 for dyspnea and fatigue.  She smoked 1.5 pack of cigarettes daily for at least 20 years and quit in 2005. She was diagnosed with breast cancer in 2010 and treated with a mastectomy, chemo/xrt.  She had simple spirometry consistent with obstruction and was started on Spiriva and did well.  HPI   09/20/2012 ROV -- Since her last visit Shannon [name] has been doing very well. She states that the Spiriva took about 3 weeks to work but it is causing her to feel much less short of breath with exertion and her energy level is up quite a bit. She says she can tell a remarkable difference when climbing a flight of stairs and exerting herself. She is sleeping better throughout the night and she is not having to nap during the day which she did before. She does have a dry cough which started on the Spiriva but this isn't particularly bothersome to her.  05/02/2013 ROV >> Shannon [name] has been doing well but she has had lots of  Back pain.  She plans to have surgery next week at [facility] for this.  Her breathing has been doing well, but she has had problems with empty spiriva capsules. Her pharmacist has filled this for her.  No cough.   Past Medical History  Diagnosis Date  . Thyroid disease     HYPO  . Restless leg syndrome   . Hx: UTI (urinary tract infection)   . Diverticulitis   . Anemia   . Reiter's disease     DIAGNOSED YEARS AGO IN [place] BY RHEUMATOLOGY  . HLD (hyperlipidemia)   . GERD (gastroesophageal reflux disease)   . Neuromuscular disorder   . Hearing loss   . Nasal congestion   . Abdominal pain   . 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 Hypertension   . Complication of anesthesia 1977    states stops breathing post op requring  "step down unit" care x 4 days  . PONV (postoperative nausea and vomiting) 2010  . Mental  disorder     "[facility] after thyroidectomy"  . COPD (chronic obstructive pulmonary disease)   . Stroke 2010    TIA      Review of Systems  Not performed    Objective:   Physical Exam   Filed Vitals:   05/02/13 1223  BP: 124/72  Pulse: 62  Temp: 97.9 F (36.6 C)  TempSrc: Oral  Height: 5\' 3"  (1.6 m)  Weight: 175 lb (79.379 kg)  SpO2: 99%   Gen: well appearing, no acute distress HEENT: NCAT, PERRL, EOMi, OP clear, neck supple without masses PULM: CTA B CV: RRR, slight systolic murmur, no JVD AB: BS+, soft, nontender, no hsm Ext: warm, no edema, no clubbing, no cyanosis  November 2013 simple spirometry showed obstruction on the flow volume loop FEV1 was greater than 80% predicted     Assessment & Plan:   COPD (chronic obstructive pulmonary disease) This has been a stable interval for Trudie from a respiratory standpoint.  She is low risk for a perioperative pulmonary complication.  Plan: -continue spiriva -incentive spirometry, out of bed asap after surgery -use spiriva day of surgery and afterwards -albuterol prn after surgery  Fatigue I question whether or not she has sleep apnea.  Plan; -home sleep study  Updated Medication List Outpatient Encounter Prescriptions as of 05/02/2013  Medication Sig Dispense Refill  . anastrozole (ARIMIDEX) 1 MG tablet Take 1 tablet (1 mg total) by mouth daily.  90 tablet  0  . CELEBREX 200 MG capsule TAKE ONE CAPSULE BY MOUTH DAILY  30 capsule  0  . cyclobenzaprine (FLEXERIL) 10 MG tablet Take 10 mg by mouth daily as needed. Muscle spasms      . hydrochlorothiazide (MICROZIDE) 12.5 MG capsule Take 12.5 mg by mouth daily before breakfast.      . levothyroxine (SYNTHROID, LEVOTHROID) 100 MCG tablet Take 100 mcg by mouth every evening.       Marland Kitchen LYRICA 75 MG capsule Take 1 capsule by mouth 2 (two) times daily.      Marland Kitchen oxyCODONE (OXY IR/ROXICODONE) 5 MG immediate release tablet Take 2 tablets by mouth every 4 (four) hours.       . sertraline (ZOLOFT) 50 MG tablet Take 50 mg by mouth daily before breakfast.      . tiotropium (SPIRIVA) 18 MCG inhalation capsule Place 1 capsule (18 mcg total) into inhaler and inhale daily.  30 capsule  0  . aspirin 81 MG tablet Take 81 mg by mouth daily.       Marland Kitchen co-enzyme Q-10 30 MG capsule Take 30 mg by mouth daily.       Marland Kitchen OVER THE COUNTER MEDICATION Vitamin B complex daily po-   ladt dose 11/11/12      . [DISCONTINUED] hydrochlorothiazide (MICROZIDE) 12.5 MG capsule TAKE ONE CAPSULE BY MOUTH DAILY  30 capsule  2  . [DISCONTINUED] ZOLOFT 50 MG tablet TAKE ONE (1) TABLET BY MOUTH EVERY DAY  30 tablet  2   No facility-administered encounter medications on file as of 05/02/2013.

## 2013-05-02 NOTE — Assessment & Plan Note (Signed)
This has been a stable interval for Shannon [name] from a respiratory standpoint.  She is low risk for a perioperative pulmonary complication.  Plan: -continue spiriva -incentive spirometry, out of bed asap after surgery -use spiriva day of surgery and afterwards -albuterol prn after surgery

## 2013-05-02 NOTE — Patient Instructions (Signed)
We will arrange for a home sleep study for you  Keep taking your spiriva as you are doing  Make sure you get out of bed as soon as possible and use your spiriva and incentive spirometry after surgery  WE will see you back in 6 months

## 2013-05-31 ENCOUNTER — Ambulatory Visit: Payer: Medicare Other | Admitting: Oncology

## 2013-05-31 ENCOUNTER — Other Ambulatory Visit: Payer: Medicare Other | Admitting: Lab

## 2013-06-01 ENCOUNTER — Telehealth: Payer: Self-pay | Admitting: Oncology

## 2013-06-07 ENCOUNTER — Ambulatory Visit: Payer: Medicare Other | Admitting: Oncology

## 2013-06-07 ENCOUNTER — Other Ambulatory Visit: Payer: Medicare Other | Admitting: Lab

## 2013-06-07 ENCOUNTER — Other Ambulatory Visit: Payer: Self-pay | Admitting: *Deleted

## 2013-06-07 MED ORDER — CELECOXIB 200 MG PO CAPS: ORAL_CAPSULE | ORAL | Status: DC

## 2013-06-07 NOTE — Telephone Encounter (Signed)
Last filled 04/27/13 

## 2013-06-09 ENCOUNTER — Ambulatory Visit (INDEPENDENT_AMBULATORY_CARE_PROVIDER_SITE_OTHER): Payer: Medicare Other | Admitting: General Surgery

## 2013-06-16 ENCOUNTER — Telehealth: Payer: Self-pay | Admitting: Oncology

## 2013-07-11 ENCOUNTER — Other Ambulatory Visit: Payer: Medicare Other | Admitting: Lab

## 2013-07-11 ENCOUNTER — Ambulatory Visit: Payer: Medicare Other | Admitting: Oncology

## 2013-07-20 ENCOUNTER — Telehealth: Payer: Self-pay | Admitting: Oncology

## 2013-07-20 ENCOUNTER — Encounter: Payer: Self-pay | Admitting: Oncology

## 2013-07-20 ENCOUNTER — Other Ambulatory Visit (HOSPITAL_BASED_OUTPATIENT_CLINIC_OR_DEPARTMENT_OTHER): Payer: Medicare Other | Admitting: Lab

## 2013-07-20 ENCOUNTER — Ambulatory Visit (HOSPITAL_BASED_OUTPATIENT_CLINIC_OR_DEPARTMENT_OTHER): Payer: Medicare Other | Admitting: Oncology

## 2013-07-20 VITALS — BP 136/84 | HR 66 | Temp 98.1°F | Resp 20 | Ht 63.0 in | Wt 176.2 lb

## 2013-07-20 DIAGNOSIS — Z853 Personal history of malignant neoplasm of breast: Secondary | ICD-10-CM

## 2013-07-20 DIAGNOSIS — R0602 Shortness of breath: Secondary | ICD-10-CM

## 2013-07-20 DIAGNOSIS — C50919 Malignant neoplasm of unspecified site of unspecified female breast: Secondary | ICD-10-CM

## 2013-07-20 DIAGNOSIS — R42 Dizziness and giddiness: Secondary | ICD-10-CM

## 2013-07-20 LAB — COMPREHENSIVE METABOLIC PANEL (CC13)
Alkaline Phosphatase: 72 U/L (ref 40–150)
Glucose: 101 mg/dl (ref 70–140)
Sodium: 142 mEq/L (ref 136–145)
Total Bilirubin: 0.63 mg/dL (ref 0.20–1.20)
Total Protein: 7.7 g/dL (ref 6.4–8.3)

## 2013-07-20 LAB — CBC WITH DIFFERENTIAL/PLATELET
EOS%: 1.6 % (ref 0.0–7.0)
Eosinophils Absolute: 0.1 10*3/uL (ref 0.0–0.5)
LYMPH%: 30.2 % (ref 14.0–49.7)
MCH: 34.3 pg — ABNORMAL HIGH (ref 25.1–34.0)
MCHC: 34.1 g/dL (ref 31.5–36.0)
MCV: 100.8 fL (ref 79.5–101.0)
MONO%: 9 % (ref 0.0–14.0)
NEUT#: 4.6 10*3/uL (ref 1.5–6.5)
Platelets: 288 10*3/uL (ref 145–400)
RBC: 3.78 10*6/uL (ref 3.70–5.45)

## 2013-07-20 NOTE — Progress Notes (Signed)
OFFICE PROGRESS NOTE  CC  Ruthe Mannan, MD [address] [place] [address] Dr. Emelia Loron Dr. 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  PRIOR THERAPY:  #1 status post right mastectomy in October 2009.  #2 she then went on to receive adjuvant chemotherapy consisting of 4 cycles of Taxotere and Cytoxan administered from 09/20/2008 through 11/21/2008.  #3 she received Arimidex 1 mg daily starting 01/29/2009.  #4 status post TRAM reconstruction of the right breast on 02/21/2009.  CURRENT THERAPY:Arimidex 1 mg daily  INTERVAL HISTORY: Shannon [name] 67 y.o. female returns for Followup visit today. She has no nausea or vomiting. She seems to be tolerating the remnant do well although she does have aches and pains. She has no peripheral paresthesias no vaginal discharge or bleeding no headaches today no double vision or blurring of vision. Remainder of the 10 point review of systems is unremarkable.  MEDICAL HISTORY: Past Medical History  Diagnosis Date  . Thyroid disease     HYPO  . Restless leg syndrome   . Hx: UTI (urinary tract infection)   . Diverticulitis   . Anemia   . Reiter's disease     DIAGNOSED YEARS AGO IN [place] BY RHEUMATOLOGY  . HLD (hyperlipidemia)   . GERD (gastroesophageal reflux disease)   . Neuromuscular disorder   . Hearing loss   . Nasal congestion   . Abdominal pain   . 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 Hypertension   . Complication of anesthesia 1977    states stops breathing post op requring  "step down unit" care x 4 days  . PONV (postoperative nausea and vomiting) 2010  . Mental disorder     "[facility] after thyroidectomy"  . COPD (chronic obstructive pulmonary disease)   . Stroke 2010    TIA    ALLERGIES:  has No Known Allergies.  MEDICATIONS:  Current Outpatient Prescriptions  Medication Sig Dispense Refill  . anastrozole  (ARIMIDEX) 1 MG tablet Take 1 tablet (1 mg total) by mouth daily.  90 tablet  0  . aspirin 81 MG tablet Take 81 mg by mouth daily.       . celecoxib (CELEBREX) 200 MG capsule TAKE ONE CAPSULE BY MOUTH DAILY  30 capsule  0  . co-enzyme Q-10 30 MG capsule Take 30 mg by mouth daily.       . cyclobenzaprine (FLEXERIL) 10 MG tablet Take 10 mg by mouth daily as needed. Muscle spasms      . hydrochlorothiazide (MICROZIDE) 12.5 MG capsule Take 12.5 mg by mouth daily before breakfast.      . levothyroxine (SYNTHROID, LEVOTHROID) 100 MCG tablet Take 100 mcg by mouth every evening.       Marland Kitchen LYRICA 75 MG capsule Take 1 capsule by mouth 2 (two) times daily.      Marland Kitchen OVER THE COUNTER MEDICATION Vitamin B complex daily po-   ladt dose 11/11/12      . oxyCODONE (OXY IR/ROXICODONE) 5 MG immediate release tablet Take 2 tablets by mouth every 4 (four) hours.      . sertraline (ZOLOFT) 50 MG tablet Take 50 mg by mouth daily before breakfast.      . tiotropium (SPIRIVA) 18 MCG inhalation capsule Place 1 capsule (18 mcg total) into inhaler and inhale daily.  30 capsule  0   No current facility-administered medications for this visit.  SURGICAL HISTORY:  Past Surgical History  Procedure Laterality Date  . Ovary surgery  2006  . Abdominal hysterectomy  1976  . Cholecystectomy  2008  . Hernia repair  2010  . Tram      right tram flap  . Breast surgery  2009    right mastectomy sent node biopsy  . Breast reconstruction      right tram, left implant and lift  . Eye surgery      RK Bilateral  . Laparoscopy  11/14/2012    Procedure: LAPAROSCOPY DIAGNOSTIC;  Surgeon: Emelia Loron, MD;  Location: [facility];  Service: General;  Laterality: N/A;    REVIEW OF SYSTEMS:  Pertinent items are noted in HPI.   PHYSICAL EXAMINATION: General appearance: alert, cooperative and appears stated age Neck: no adenopathy, no carotid bruit, no JVD, supple, symmetrical, trachea midline and thyroid not enlarged, symmetric, no  tenderness/mass/nodules Lymph nodes: Cervical, supraclavicular, and axillary nodes normal. Resp: clear to auscultation bilaterally and normal percussion bilaterally Back: symmetric, no curvature. ROM normal. No CVA tenderness. Cardio: regular rate and rhythm, S1, S2 normal, no murmur, click, rub or gallop GI: soft, non-tender; bowel sounds normal; no masses,  no organomegaly Extremities: extremities normal, atraumatic, no cyanosis or edema Neurologic: Grossly normal Bilateral breast examination right constructed breast is without any skin changes well healed surgical scar. Left breast no masses nipple discharge no skin changes. ECOG PERFORMANCE STATUS: 1 - Symptomatic but completely ambulatory  Blood pressure 136/84, pulse 66, temperature 98.1 F (36.7 C), temperature source Oral, resp. rate 20, height 5\' 3"  (1.6 m), weight 176 lb 3.2 oz (79.924 kg).  LABORATORY DATA: Lab Results  Component Value Date   WBC 7.8 07/20/2013   HGB 13.0 07/20/2013   HCT 38.1 07/20/2013   MCV 100.8 07/20/2013   PLT 288 07/20/2013       RADIOGRAPHIC STUDIES:  No results found.  ASSESSMENT: 67 year old female with  #1 stage II lobular carcinoma of the right breast status post mastectomy. She is status post adjuvant chemotherapy consisting of Taxotere and Cytoxan. She is now on Arimidex overall she is doing well she has no evidence of recurrent disease.  #2 shortness of breath   #3 dizziness and lightheadedness unclear etiology question really arises whether this could possibly be related to the Arimidex. She and I discussed holding the Arimidex for at least 6 weeks and reevaluating her. She is instructed to call me in 6 weeks time to see what her symptoms are like.  PLAN:  #1 hold Arimidex for 6 weeks. Patient is instructed to call me back.  #2 in meantime I will plan on seeing her back in 6 months time.  #3 patient is considering relocating to [place] and she will call me if this happens  sooner than 6 months.   All questions were answered. The patient knows to call the clinic with any problems, questions or concerns. We can certainly see the patient much sooner if necessary.  I spent 15 minutes counseling the patient face to face. The total time spent in the appointment was 20 minutes.    Drue Second, MD Medical/Oncology [facility] [phone] (beeper) [phone] (Office)  07/20/2013, 10:37 AM

## 2013-07-20 NOTE — Telephone Encounter (Signed)
, °

## 2013-07-20 NOTE — Patient Instructions (Addendum)
Doing well  We will see you back in 8 months

## 2013-07-21 ENCOUNTER — Telehealth: Payer: Self-pay

## 2013-07-21 NOTE — Telephone Encounter (Signed)
Signed and in my box. 

## 2013-07-21 NOTE — Telephone Encounter (Signed)
[employer] faxed prior auth Zoloft; form in Dr Elmer Sow in box. Pt said generic zoloft was not effective. Pt has not tried other meds.

## 2013-07-21 NOTE — Telephone Encounter (Signed)
Rena, Did you take care of this?  I couldn't find it in Dr. Elmer Sow box.

## 2013-07-21 NOTE — Telephone Encounter (Signed)
Carrie faxed to [facility] and gave me PA form to hold at my desk until gets response from [facility].

## 2013-07-25 NOTE — Telephone Encounter (Signed)
Approval letter received; [place] notified; placed Dr Elmer Sow in box for signature and scanning.

## 2013-07-26 ENCOUNTER — Other Ambulatory Visit: Payer: Self-pay | Admitting: *Deleted

## 2013-07-26 MED ORDER — TIOTROPIUM BROMIDE MONOHYDRATE 18 MCG IN CAPS: 18.0000 ug | ORAL_CAPSULE | Freq: Every day | RESPIRATORY_TRACT | Status: DC

## 2013-07-26 NOTE — Telephone Encounter (Signed)
Called patient to follow up on refill request, since last note mentioned discussion about holding medication for 6 weeks to see if her dizziness improves. She has not been holding the medication and does not recall the conversation at visit. Will clarify w/MD if OK to refill at this time. Patient thinks her dizziness is related to her "back issues". Refill request forwarded to collaborative nurse desk.

## 2013-07-28 ENCOUNTER — Other Ambulatory Visit: Payer: Self-pay | Admitting: *Deleted

## 2013-07-28 DIAGNOSIS — Z853 Personal history of malignant neoplasm of breast: Secondary | ICD-10-CM

## 2013-07-28 MED ORDER — ANASTROZOLE 1 MG PO TABS: 1.0000 mg | ORAL_TABLET | Freq: Every day | ORAL | Status: DC

## 2013-08-04 ENCOUNTER — Other Ambulatory Visit: Payer: Self-pay | Admitting: Orthopedic Surgery

## 2013-08-04 DIAGNOSIS — M48061 Spinal stenosis, lumbar region without neurogenic claudication: Secondary | ICD-10-CM

## 2013-08-18 ENCOUNTER — Other Ambulatory Visit: Payer: Self-pay | Admitting: *Deleted

## 2013-08-18 MED ORDER — SERTRALINE HCL 50 MG PO TABS: 50.0000 mg | ORAL_TABLET | Freq: Every day | ORAL | Status: DC

## 2013-08-18 MED ORDER — HYDROCHLOROTHIAZIDE 12.5 MG PO CAPS: 12.5000 mg | ORAL_CAPSULE | Freq: Every day | ORAL | Status: DC

## 2013-08-18 NOTE — Telephone Encounter (Signed)
Fax refill request, Dr. Dayton Martes out of office, please advise

## 2013-08-19 ENCOUNTER — Ambulatory Visit
Admission: RE | Admit: 2013-08-19 | Discharge: 2013-08-19 | Disposition: A | Discharge status: Home / Self Care | Payer: Medicare Other | Source: Ambulatory Visit | Attending: Orthopedic Surgery | Admitting: Orthopedic Surgery

## 2013-08-19 DIAGNOSIS — M48061 Spinal stenosis, lumbar region without neurogenic claudication: Secondary | ICD-10-CM

## 2013-10-05 ENCOUNTER — Other Ambulatory Visit: Payer: Self-pay | Admitting: Family Medicine

## 2013-11-01 ENCOUNTER — Encounter: Payer: Self-pay | Admitting: Pulmonary Disease

## 2013-11-01 ENCOUNTER — Ambulatory Visit (INDEPENDENT_AMBULATORY_CARE_PROVIDER_SITE_OTHER): Payer: Medicare Other | Admitting: Pulmonary Disease

## 2013-11-01 ENCOUNTER — Telehealth: Payer: Self-pay | Admitting: Pulmonary Disease

## 2013-11-01 VITALS — BP 142/86 | HR 64 | Temp 97.8°F | Ht 63.0 in | Wt 180.0 lb

## 2013-11-01 DIAGNOSIS — R0989 Other specified symptoms and signs involving the circulatory and respiratory systems: Secondary | ICD-10-CM

## 2013-11-01 DIAGNOSIS — J4489 Other specified chronic obstructive pulmonary disease: Secondary | ICD-10-CM

## 2013-11-01 DIAGNOSIS — J449 Chronic obstructive pulmonary disease, unspecified: Secondary | ICD-10-CM

## 2013-11-01 DIAGNOSIS — R0609 Other forms of dyspnea: Secondary | ICD-10-CM

## 2013-11-01 NOTE — Telephone Encounter (Signed)
Spoke with pt. She is needing to speak with PCC's to r/s her tests she is having done. Please advise thanks

## 2013-11-01 NOTE — Assessment & Plan Note (Signed)
Ambulatory oxygenation today was normal on room air See note above, COPD

## 2013-11-01 NOTE — Assessment & Plan Note (Addendum)
She has been having more dyspnea and cough in the last few months.  Her airflow obstruction was minimal on the 2013 study.  So it's not clear to me if she is developing worsening obstruction or if something else is going on.  Plan: -change from Spiriva to breo for a month -full PFT -based on her prior heavy tobacco use (about 50 pack years) we need to get a lung cancer screening CT

## 2013-11-01 NOTE — Progress Notes (Signed)
Subjective:    Patient ID: Shannon [name], female    DOB: 11/19/1945, 68 y.o.   MRN: [id number]  Synopsis: Shannon [name] was evaluated by [place] [facility] in November 2013 for dyspnea and fatigue.  She smoked 1.5 pack of cigarettes daily for at least 20 years and quit in 2005. She was diagnosed with breast cancer in 2010 and treated with a mastectomy, chemo/xrt.  She had simple spirometry consistent with obstruction and was started on Spiriva and did well.  HPI   09/20/2012 ROV -- Since her last visit Shannon [name] has been doing very well. She states that the Spiriva took about 3 weeks to work but it is causing her to feel much less short of breath with exertion and her energy level is up quite a bit. She says she can tell a remarkable difference when climbing a flight of stairs and exerting herself. She is sleeping better throughout the night and she is not having to nap during the day which she did before. She does have a dry cough which started on the Spiriva but this isn't particularly bothersome to her.  05/02/2013 ROV >> Shannon [name] has been doing well but she has had lots of  Back pain.  She plans to have surgery next week at [facility] for this.  Her breathing has been doing well, but she has had problems with empty spiriva capsules. Her pharmacist has filled this for her.  No cough.   11/01/2013 ROV >> The last 6 months have been busy.  She had two back surgeries at [facility].  The first was complicated by an infection so she had to undergo a washout.  She has moved since the first of December.  She now lives in [place].  She has experienced some dyspnea with heavy activity since the last visit.  She has to take lot of deep breaths due to dyspnea. She has a nagging dry hack every once in a while.  No wheezing. No chest pain or tightness.  No mucus production.  She still has back pain and continues to have numbness in her left leg.   Past Medical History  Diagnosis Date  . Thyroid disease     HYPO  . Restless leg  syndrome   . Hx: UTI (urinary tract infection)   . Diverticulitis   . Anemia   . Reiter's disease     DIAGNOSED YEARS AGO IN [place] BY RHEUMATOLOGY  . HLD (hyperlipidemia)   . GERD (gastroesophageal reflux disease)   . Neuromuscular disorder   . Hearing loss   . Nasal congestion   . Abdominal pain   . 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 Hypertension   . Complication of anesthesia 1977    states stops breathing post op requring  "step down unit" care x 4 days  . PONV (postoperative nausea and vomiting) 2010  . 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 COPD (chronic obstructive pulmonary disease)   . Stroke 2010    TIA      Review of Systems  Constitutional: Positive for fatigue. Negative for fever and chills.  HENT: Negative for postnasal drip, rhinorrhea and sinus pressure.   Respiratory: Positive for cough and shortness of breath. Negative for wheezing.   Cardiovascular: Negative for chest pain, palpitations and leg swelling.       Objective:   Physical Exam   Filed Vitals:   11/01/13 1151  BP: 142/86  Pulse: 64  Temp:  97.8 F (36.6 C)  TempSrc: Oral  Height: 5\' 3"  (1.6 m)  Weight: 180 lb (81.647 kg)  SpO2: 98%  RA  Gen: well appearing, no acute distress HEENT: NCAT, EOMi, OP clear PULM: CTA B CV: RRR, slight systolic murmur, no JVD AB: BS+, soft, nontender, no hsm Ext: warm, no edema, no clubbing, no cyanosis  November 2013 simple spirometry showed obstruction on the flow volume loop FEV1 was greater than 80% predicted     Assessment & Plan:   COPD (chronic obstructive pulmonary disease) She has been having more dyspnea and cough in the last few months.  Her airflow obstruction was minimal on the 2013 study.  So it's not clear to me if she is developing worsening obstruction or if something else is going on.  Plan: -change from Spiriva to breo for a month -full PFT -based on her prior heavy tobacco use (about 50 pack years) we  need to get a lung cancer screening CT  DOE (dyspnea on exertion) Ambulatory oxygenation today was normal on room air See note above, COPD     Updated Medication List Outpatient Encounter Prescriptions as of 11/01/2013  Medication Sig  . anastrozole (ARIMIDEX) 1 MG tablet Take 1 tablet (1 mg total) by mouth daily.  Marland Kitchen aspirin 81 MG tablet Take 81 mg by mouth daily.   . CELEBREX 200 MG capsule TAKE ONE CAPSULE BY MOUTH DAILY  . cyclobenzaprine (FLEXERIL) 10 MG tablet Take 10 mg by mouth daily as needed. Muscle spasms  . gabapentin (NEURONTIN) 100 MG capsule Take 100 mg by mouth 3 (three) times daily.  . hydrochlorothiazide (MICROZIDE) 12.5 MG capsule Take 1 capsule (12.5 mg total) by mouth daily before breakfast.  . levothyroxine (SYNTHROID, LEVOTHROID) 100 MCG tablet Take 100 mcg by mouth every evening.   Marland Kitchen oxyCODONE (OXY IR/ROXICODONE) 5 MG immediate release tablet Take 2 tablets by mouth every 4 (four) hours.  . sertraline (ZOLOFT) 50 MG tablet Take 1 tablet (50 mg total) by mouth daily before breakfast.  . tiotropium (SPIRIVA) 18 MCG inhalation capsule Place 1 capsule (18 mcg total) into inhaler and inhale daily.  . [DISCONTINUED] co-enzyme Q-10 30 MG capsule Take 30 mg by mouth daily.   . [DISCONTINUED] LYRICA 75 MG capsule Take 1 capsule by mouth 2 (two) times daily.  . [DISCONTINUED] OVER THE COUNTER MEDICATION Vitamin B complex daily po-   ladt dose 11/11/12

## 2013-11-01 NOTE — Patient Instructions (Signed)
We will order full pulmonary function testing from [facility] We will order a screening CT scan from [place] for a month Use Breo once a day for a month We will see you back in 4 weeks or sooner if needed

## 2013-11-02 ENCOUNTER — Encounter: Payer: Self-pay | Admitting: Pulmonary Disease

## 2013-11-02 LAB — ALPHA-1-ANTITRYPSIN PHENOTYP: A1 ANTITRYPSIN: 141 mg/dL (ref 90–200)

## 2013-11-02 NOTE — Telephone Encounter (Signed)
It appears that the PFT and the CT has been scheduled at [facility]. Will have Alida return patient's call with instructions. Rhonda J Cobb

## 2013-11-02 NOTE — Telephone Encounter (Signed)
[phone] returning call

## 2013-11-02 NOTE — Telephone Encounter (Signed)
Spoke with pt who was sched for 11/06/13 for ct and pfts, she has to cancel. Appts cancelled and resched to 11/10/13 for ct and 11/14/13 for pfts @ [place] .Verdie Mosher

## 2013-11-03 ENCOUNTER — Telehealth: Payer: Self-pay

## 2013-11-03 NOTE — Telephone Encounter (Signed)
Relayed results to pt, she is aware.  Nothing further needed.  

## 2013-11-03 NOTE — Telephone Encounter (Signed)
Message copied by Len Blalock on Fri Nov 03, 2013  4:09 PM ------      Message from: Simonne Maffucci B      Created: Thu Nov 02, 2013  5:36 PM       A,            Please let her know this was normal            Thanks      B ------

## 2013-11-10 ENCOUNTER — Ambulatory Visit: Payer: Self-pay | Admitting: Pulmonary Disease

## 2013-11-14 ENCOUNTER — Ambulatory Visit: Payer: Self-pay | Admitting: Pulmonary Disease

## 2013-11-14 LAB — PULMONARY FUNCTION TEST

## 2013-11-20 ENCOUNTER — Encounter: Payer: Self-pay | Admitting: Pulmonary Disease

## 2013-11-20 ENCOUNTER — Telehealth: Payer: Self-pay

## 2013-11-20 DIAGNOSIS — R911 Solitary pulmonary nodule: Secondary | ICD-10-CM | POA: Insufficient documentation

## 2013-11-20 NOTE — Telephone Encounter (Signed)
Pt aware of ct chest and PFT results.  Nothing further needed at this time.

## 2013-11-20 NOTE — Telephone Encounter (Signed)
Message copied by Len Blalock on Mon Nov 20, 2013 10:22 AM ------      Message from: Simonne Maffucci B      Created: Mon Nov 20, 2013 10:15 AM       A,            Please let her know that her CT showed a very small nodule which is likely benign.  It was in her right lung. Because of this she needs another CT scan in 6 months to look at it again.  We'll talk about it on the next visit.      Thakns      b ------

## 2013-11-23 ENCOUNTER — Ambulatory Visit (INDEPENDENT_AMBULATORY_CARE_PROVIDER_SITE_OTHER): Payer: Medicare Other | Admitting: Pulmonary Disease

## 2013-11-23 ENCOUNTER — Encounter: Payer: Self-pay | Admitting: Pulmonary Disease

## 2013-11-23 VITALS — BP 128/68 | HR 65 | Ht 63.0 in | Wt 178.0 lb

## 2013-11-23 DIAGNOSIS — R911 Solitary pulmonary nodule: Secondary | ICD-10-CM

## 2013-11-23 DIAGNOSIS — J449 Chronic obstructive pulmonary disease, unspecified: Secondary | ICD-10-CM

## 2013-11-23 MED ORDER — FLUTICASONE FUROATE-VILANTEROL 100-25 MCG/INH IN AEPB: 1.0000 | INHALATION_SPRAY | Freq: Every day | RESPIRATORY_TRACT | Status: DC

## 2013-11-23 NOTE — Patient Instructions (Signed)
Take the Breo daily no matter how you feel We will see you back in 6 months after the CT scan or sooner if needed

## 2013-11-23 NOTE — Assessment & Plan Note (Addendum)
She did well with Breo last month as compared to the Spiriva.  There was nothing on the CT scan to explain shortness of breath other than her known diagnosis of COPD.  Plan: -Start Breo -Stop Spiriva

## 2013-11-23 NOTE — Assessment & Plan Note (Signed)
And she 6 nodule in her right upper lobe which was benign in appearance. However, given her smoking history and personal history of malignancy we'll need to follow this. Her neck CT scan will be 6 months from now.

## 2013-11-23 NOTE — Progress Notes (Signed)
Subjective:    Patient ID: Shannon [name], female    DOB: 21-May-1946, 68 y.o.   MRN: [id number]  Synopsis: Shannon [name] was evaluated by [place] [facility] in November 2013 for dyspnea and fatigue.  Shannon [name] smoked 1.5 pack of cigarettes daily for at least 20 years and quit in 2005. Shannon [name] was diagnosed with breast cancer in 2010 and treated with a mastectomy, chemo/xrt.  Shannon [name] had simple spirometry consistent with obstruction and was started on Spiriva and did well.  HPI   09/20/2012 ROV -- Since her last visit Shannon [name] has been doing very well. Shannon [name] states that the Spiriva took about 3 weeks to work but it is causing her to feel much less short of breath with exertion and her energy level is up quite a bit. Shannon [name] says Shannon [name] can tell a remarkable difference when climbing a flight of stairs and exerting herself. Shannon [name] is sleeping better throughout the night and Shannon [name] is not having to nap during the day which Shannon [name] did before. Shannon [name] does have a dry cough which started on the Spiriva but this isn't particularly bothersome to her.  05/02/2013 ROV >> Shannon [name] has been doing well but Shannon [name] has had lots of  Back pain.  Shannon [name] plans to have surgery next week at [facility] for this.  Her breathing has been doing well, but Shannon [name] has had problems with empty spiriva capsules. Her pharmacist has filled this for her.  No cough.   11/01/2013 ROV >> The last 6 months have been busy.  Shannon [name] had two back surgeries at [facility].  The first was complicated by an infection so Shannon [name] had to undergo a washout.  Shannon [name] has moved since the first of December.  Shannon [name] now lives in [place].  Shannon [name] has experienced some dyspnea with heavy activity since the last visit.  Shannon [name] has to take lot of deep breaths due to dyspnea. Shannon [name] has a nagging dry hack every once in a while.  No wheezing. No chest pain or tightness.  No mucus production.  Shannon [name] still has back pain and continues to have numbness in her left leg.   11/23/2012 ROV >> Shannon [name] thinks that the Breo hasn't really made much difference since the  last visit.  Shannon [name] never felt like Shannon [name] was getting anything in her lungs (kind of like her complaint with the spiriva).  Shannon [name] was not any worse than Shannon [name] was on the Spiriva.  Shannon [name] felt exhausted at the end of the breathing test.  Shannon [name] is really anxious about the nodule on her chest because of her history of breast cancer.  Past Medical History  Diagnosis Date  . Thyroid disease     HYPO  . Restless leg syndrome   . Hx: UTI (urinary tract infection)   . Diverticulitis   . Anemia   . Reiter's disease     DIAGNOSED YEARS AGO IN [place] BY RHEUMATOLOGY  . HLD (hyperlipidemia)   . GERD (gastroesophageal reflux disease)   . Neuromuscular disorder   . Hearing loss   . Nasal congestion   . Abdominal pain   . 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 Hypertension   . Complication of anesthesia 1977    states stops breathing post op requring  "step down unit" care x 4 days  . PONV (postoperative nausea and vomiting) 2010  . 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 COPD (chronic obstructive pulmonary disease)   . Stroke 2010    TIA  Review of Systems  Constitutional: Positive for fatigue. Negative for fever and chills.  HENT: Negative for postnasal drip, rhinorrhea and sinus pressure.   Respiratory: Positive for shortness of breath. Negative for cough and wheezing.   Cardiovascular: Negative for chest pain, palpitations and leg swelling.       Objective:   Physical Exam   Filed Vitals:   11/23/13 1056  BP: 128/68  Pulse: 65  Height: 5\' 3"  (1.6 m)  Weight: 178 lb (80.74 kg)  SpO2: 99%  RA  Gen: well appearing, no acute distress HEENT: NCAT, EOMi, OP clear PULM: CTA B CV: RRR, slight systolic murmur, no JVD AB: BS+, soft, nontender, no hsm Ext: warm, no edema, no clubbing, no cyanosis  November 2013 simple spirometry showed obstruction on the flow volume loop FEV1 was greater than 80% predicted     Assessment & Plan:   Solitary pulmonary nodule And Shannon [name] 6  nodule in her right upper lobe which was benign in appearance. However, given her smoking history and personal history of malignancy we'll need to follow this. Her neck CT scan will be 6 months from now.  COPD (chronic obstructive pulmonary disease) Shannon [name] did well with Breo last month as compared to the Spiriva.  Plan: -Start Breo -Stop Spiriva    Updated Medication List Outpatient Encounter Prescriptions as of 11/23/2013  Medication Sig  . anastrozole (ARIMIDEX) 1 MG tablet Take 1 tablet (1 mg total) by mouth daily.  Marland Kitchen aspirin 81 MG tablet Take 81 mg by mouth daily.   . CELEBREX 200 MG capsule TAKE ONE CAPSULE BY MOUTH DAILY  . cyclobenzaprine (FLEXERIL) 10 MG tablet Take 10 mg by mouth daily as needed. Muscle spasms  . gabapentin (NEURONTIN) 100 MG capsule Take 100 mg by mouth 3 (three) times daily.  . hydrochlorothiazide (MICROZIDE) 12.5 MG capsule Take 1 capsule (12.5 mg total) by mouth daily before breakfast.  . levothyroxine (SYNTHROID, LEVOTHROID) 100 MCG tablet Take 100 mcg by mouth every evening.   Marland Kitchen oxyCODONE (OXY IR/ROXICODONE) 5 MG immediate release tablet Take 1 tablet by mouth at bedtime.   . sertraline (ZOLOFT) 50 MG tablet Take 1 tablet (50 mg total) by mouth daily before breakfast.  . tiotropium (SPIRIVA) 18 MCG inhalation capsule Place 1 capsule (18 mcg total) into inhaler and inhale daily.

## 2013-12-19 ENCOUNTER — Other Ambulatory Visit: Payer: Self-pay

## 2013-12-19 DIAGNOSIS — Z853 Personal history of malignant neoplasm of breast: Secondary | ICD-10-CM

## 2013-12-19 DIAGNOSIS — Z9011 Acquired absence of right breast and nipple: Secondary | ICD-10-CM

## 2013-12-19 DIAGNOSIS — Z1231 Encounter for screening mammogram for malignant neoplasm of breast: Secondary | ICD-10-CM

## 2013-12-26 ENCOUNTER — Encounter: Payer: Self-pay | Admitting: Pulmonary Disease

## 2014-01-01 ENCOUNTER — Encounter: Payer: Self-pay | Admitting: Pulmonary Disease

## 2014-01-02 ENCOUNTER — Ambulatory Visit: Payer: Medicare Other

## 2014-01-08 ENCOUNTER — Other Ambulatory Visit: Payer: Self-pay | Admitting: Family Medicine

## 2014-01-08 NOTE — Telephone Encounter (Signed)
Last office visit 12/19/2012.  Ok to refill?

## 2014-01-24 ENCOUNTER — Telehealth: Payer: Self-pay | Admitting: Pulmonary Disease

## 2014-01-24 ENCOUNTER — Telehealth: Payer: Self-pay | Admitting: Family Medicine

## 2014-01-24 NOTE — Telephone Encounter (Signed)
Spoke with pts husband; for last 2-3 days pt has had non prod cough and cough worsened today; SOB and labored breathing; pt has COPD and hx of pneumonia x 4. No fever and no earache but does have S/T. Dr Deborra Medina advised no appts available at [facility] and Mr Segall will contact Dr [place] Bells pulmonologist.

## 2014-01-24 NOTE — Telephone Encounter (Signed)
Pt's husband is calling concerning his wife's situation. She has had pneumonia 4 times and has COPD. She got up this morning and is coughing really badly and sounds winded. I did tell pt Dr. Deborra Medina had no more slots open and he wanted me to send a message back to Dr. Deborra Medina to see what they needed to do. Please advise.

## 2014-01-24 NOTE — Telephone Encounter (Signed)
Called and spoke with the pt's spouse  Pt c/o having sore throat for about a wk now, and for the past 2 days she is having cough and increased SOB  The cough is prod with very min sputum-? Color  No fever  No appts open today  OV with [place] for 10:30 tomorrow and he is to call or seek emergent care if needed sooner

## 2014-01-25 ENCOUNTER — Ambulatory Visit: Payer: Medicare Other | Admitting: Pulmonary Disease

## 2014-02-05 ENCOUNTER — Ambulatory Visit
Admission: RE | Admit: 2014-02-05 | Discharge: 2014-02-05 | Disposition: A | Discharge status: Home / Self Care | Payer: Medicare Other | Source: Ambulatory Visit

## 2014-02-05 DIAGNOSIS — Z9011 Acquired absence of right breast and nipple: Secondary | ICD-10-CM

## 2014-02-05 DIAGNOSIS — Z1231 Encounter for screening mammogram for malignant neoplasm of breast: Secondary | ICD-10-CM

## 2014-02-05 DIAGNOSIS — Z853 Personal history of malignant neoplasm of breast: Secondary | ICD-10-CM

## 2014-02-06 ENCOUNTER — Telehealth: Payer: Self-pay | Admitting: Oncology

## 2014-02-06 NOTE — Telephone Encounter (Signed)
kk out - moved 4/23 appt to 5/29 w/dr moore. s/w pt she is aware.

## 2014-02-08 ENCOUNTER — Ambulatory Visit: Payer: Medicare Other | Admitting: Oncology

## 2014-02-08 ENCOUNTER — Other Ambulatory Visit: Payer: Medicare Other

## 2014-03-16 ENCOUNTER — Other Ambulatory Visit: Payer: Medicare Other

## 2014-03-16 ENCOUNTER — Ambulatory Visit: Payer: Medicare Other

## 2014-05-14 ENCOUNTER — Ambulatory Visit: Payer: Self-pay | Admitting: Pulmonary Disease

## 2014-05-15 ENCOUNTER — Encounter: Payer: Self-pay | Admitting: Pulmonary Disease

## 2014-05-16 ENCOUNTER — Telehealth: Payer: Self-pay

## 2014-05-16 NOTE — Telephone Encounter (Signed)
Pt returned call, please call back at  [phone]

## 2014-05-16 NOTE — Telephone Encounter (Signed)
Message copied by Len Blalock on Wed May 16, 2014  4:19 PM ------      Message from: Shannon [name]      Created: Tue May 15, 2014  9:47 PM       A,      Please let her know that her CT was normal and we need to get another CT chest in 18 months      Thanks      B ------

## 2014-05-16 NOTE — Telephone Encounter (Signed)
lmtcb X1 to relay results.  Reminder has been placed in my reminder box for ct chest in 18 mos.

## 2014-05-16 NOTE — Telephone Encounter (Signed)
Spoke with pt, she is aware of results and recs.  Nothing further needed. 

## 2014-05-30 ENCOUNTER — Other Ambulatory Visit: Payer: Self-pay

## 2014-05-30 ENCOUNTER — Telehealth: Payer: Self-pay | Admitting: Pulmonary Disease

## 2014-05-30 MED ORDER — FLUTICASONE FUROATE-VILANTEROL 100-25 MCG/INH IN AEPB: 1.0000 | INHALATION_SPRAY | Freq: Every day | RESPIRATORY_TRACT | Status: DC

## 2014-05-30 NOTE — Telephone Encounter (Signed)
Called spoke w/ pt. She report wants OV with BQ. appt scheduled in [place] 06/05/14. Nothing further needed

## 2014-05-30 NOTE — Telephone Encounter (Signed)
Called spoke with pt. Aware RX sent in. Nothing further needed 

## 2014-06-05 ENCOUNTER — Encounter: Payer: Self-pay | Admitting: Pulmonary Disease

## 2014-06-05 ENCOUNTER — Ambulatory Visit (INDEPENDENT_AMBULATORY_CARE_PROVIDER_SITE_OTHER): Payer: Medicare Other | Admitting: Pulmonary Disease

## 2014-06-05 ENCOUNTER — Other Ambulatory Visit (INDEPENDENT_AMBULATORY_CARE_PROVIDER_SITE_OTHER): Payer: Medicare Other

## 2014-06-05 ENCOUNTER — Ambulatory Visit (INDEPENDENT_AMBULATORY_CARE_PROVIDER_SITE_OTHER)
Admission: RE | Admit: 2014-06-05 | Discharge: 2014-06-05 | Disposition: A | Discharge status: Home / Self Care | Payer: Medicare Other | Source: Ambulatory Visit | Attending: Pulmonary Disease | Admitting: Pulmonary Disease

## 2014-06-05 VITALS — BP 142/78 | HR 78 | Ht 63.0 in | Wt 172.0 lb

## 2014-06-05 DIAGNOSIS — J90 Pleural effusion, not elsewhere classified: Secondary | ICD-10-CM | POA: Insufficient documentation

## 2014-06-05 DIAGNOSIS — J9610 Chronic respiratory failure, unspecified whether with hypoxia or hypercapnia: Secondary | ICD-10-CM

## 2014-06-05 DIAGNOSIS — R911 Solitary pulmonary nodule: Secondary | ICD-10-CM

## 2014-06-05 DIAGNOSIS — R0902 Hypoxemia: Secondary | ICD-10-CM

## 2014-06-05 DIAGNOSIS — J9611 Chronic respiratory failure with hypoxia: Secondary | ICD-10-CM

## 2014-06-05 LAB — BASIC METABOLIC PANEL
BUN: 19 mg/dL (ref 6–23)
CALCIUM: 9.9 mg/dL (ref 8.4–10.5)
CO2: 32 mEq/L (ref 19–32)
CREATININE: 1 mg/dL (ref 0.4–1.2)
Chloride: 93 mEq/L — ABNORMAL LOW (ref 96–112)
GFR: 60.14 mL/min (ref >60.00)
Glucose, Bld: 129 mg/dL — ABNORMAL HIGH (ref 70–99)
Potassium: 3.6 mEq/L (ref 3.5–5.1)
Sodium: 139 mEq/L (ref 135–145)

## 2014-06-05 NOTE — Assessment & Plan Note (Signed)
She worries that this may have been worsening.  We can get a lung function test later this year once the pleural effusions have resolved.  Plan: -Continue Breo

## 2014-06-05 NOTE — Patient Instructions (Signed)
Stop lasix now We will call you with the results of your blood work and chest x-ray We will ask for a copy of your CT scan from [place] taking [facility] We will see you back in 4-6 weeks in [place] or sooner if needed

## 2014-06-05 NOTE — Assessment & Plan Note (Signed)
At this point continue 2 L at night. I advised her that she does not use it during the day. She would like 3 visit the possibility of obstructive sleep apnea as apparently in the hospital is that she snored heavily. She thinks that the sleep study she had a few years ago was inaccurate because she did not fall asleep.

## 2014-06-05 NOTE — Assessment & Plan Note (Signed)
She had a CT scan performed in August of 2015 at [facility] but unfortunately no comment was made on the pulmonary nodule.  Plan: -I. will request a hard copies of the images so that I can see if the nodule has changed

## 2014-06-05 NOTE — Progress Notes (Signed)
Friday the segments the Subjective:    Patient ID: Shannon [name], female    DOB: [birth year], 68 y.o.   MRN: [id number]  Synopsis: Ms. Shannon [name] was evaluated by [place] [facility] in November 2013 for dyspnea and fatigue.  She smoked 1.5 pack of cigarettes daily for at least 20 years and quit in 2005. She was diagnosed with breast cancer in 2010 and treated with a mastectomy, chemo/xrt.  She had simple spirometry consistent with obstruction and was started on Spiriva and did well.  HPI  06/05/2014 ROV > Shannon [name] had back surgery again at [facility] last month but unfortunatley she has been having trouble with her legs despite that. Unfortunately this surgery was complicated by volume overload afterwards. She had a CT scan of her chest on the day of surgery showed large bilateral pleural effusions. An echocardiogram was also performed which showed normal RV and LV function. She was treated with diuresis and oxygen therapy in the hospital. She was discharged home on 2 L of oxygen 24 hours a day. She has since weaned this off to only using oxygen at night at this point. She says that she has been monitoring the O2 saturation at home and it has been between 90-94% even on exertion. Her shortness of breath has improved slowly. She does not have leg swelling. She does not have cough, mucus production, or wheeze. Diminished in bases bilaterally  Past Medical History  Diagnosis Date  . Thyroid disease     HYPO  . Restless leg syndrome   . Hx: UTI (urinary tract infection)   . Diverticulitis   . Anemia   . Reiter's disease     DIAGNOSED YEARS AGO IN [place] BY RHEUMATOLOGY  . HLD (hyperlipidemia)   . GERD (gastroesophageal reflux disease)   . Neuromuscular disorder   . Hearing loss   . Nasal congestion   . Abdominal pain   . 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 Hypertension   . Complication of anesthesia 1977    states stops breathing post op requring  "step down unit" care x 4 days  . PONV  (postoperative nausea and vomiting) 2010  . 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 COPD (chronic obstructive pulmonary disease)   . Stroke 2010    TIA      Review of Systems  Constitutional: Positive for fatigue. Negative for fever and chills.  HENT: Negative for postnasal drip, rhinorrhea and sinus pressure.   Respiratory: Positive for shortness of breath. Negative for cough and wheezing.   Cardiovascular: Negative for chest pain, palpitations and leg swelling.       Objective:   Physical Exam   Filed Vitals:   06/05/14 1653  BP: 142/78  Pulse: 78  Height: 5\' 3"  (1.6 m)  Weight: 172 lb (78.019 kg)  SpO2: 96%  RA  Gen: well appearing, no acute distress HEENT: NCAT, EOMi, OP clear PULM: Diminished in bases bilaterally  CV: RRR, slight systolic murmur, no JVD AB: BS+, soft, nontender, no hsm Ext: warm, no edema, no clubbing, no cyanosis  November 2013 simple spirometry showed obstruction on the flow volume loop FEV1 was greater than 80% predicted     Assessment & Plan:   Pleural effusion Showed bilateral pleural effusions immediately after surgery which considering the clinical context are most likely due to over aggressive IV fluid resuscitation. She had an echocardiogram today which was normal.  She appears to be improving on physical exam.  Plan: -Check a chest x-ray today -I instructed her to stop using Lasix as her oxygenation has improved significantly -I advised her to watch her weight very carefully and start taking Lasix again if her weight is up by more than 3 pounds in 24 hours.  Solitary pulmonary nodule She had a CT scan performed in August of 2015 at [facility] but unfortunately no comment was made on the pulmonary nodule.  Plan: -I. will request a hard copies of the images so that I can see if the nodule has changed  COPD (chronic obstructive pulmonary disease) She worries that this may have been worsening.  We can get a lung function test  later this year once the pleural effusions have resolved.  Plan: -Continue Breo   Chronic hypoxemic respiratory failure At this point continue 2 L at night. I advised her that she does not use it during the day. She would like 3 visit the possibility of obstructive sleep apnea as apparently in the hospital is that she snored heavily. She thinks that the sleep study she had a few years ago was inaccurate because she did not fall asleep.    Updated Medication List Outpatient Encounter Prescriptions as of 06/05/2014  Medication Sig  . anastrozole (ARIMIDEX) 1 MG tablet Take 1 tablet (1 mg total) by mouth daily.  Marland Kitchen aspirin 81 MG tablet Take 81 mg by mouth daily.   . cyclobenzaprine (FLEXERIL) 10 MG tablet Take 10 mg by mouth daily as needed. Muscle spasms  . Fluticasone Furoate-Vilanterol (BREO ELLIPTA) 100-25 MCG/INH AEPB Inhale 1 puff into the lungs daily.  . furosemide (LASIX) 40 MG tablet Take 40 mg by mouth daily.  Marland Kitchen gabapentin (NEURONTIN) 100 MG capsule Take 100 mg by mouth 3 (three) times daily.  . hydrochlorothiazide (MICROZIDE) 12.5 MG capsule TAKE ONE (1) CAPSULE BY MOUTH EACH DAY BEFORE BREAKFAST  . levothyroxine (SYNTHROID, LEVOTHROID) 100 MCG tablet Take 100 mcg by mouth every evening.   Marland Kitchen oxyCODONE (OXY IR/ROXICODONE) 5 MG immediate release tablet Take 1 tablet by mouth 3 (three) times daily as needed.   . potassium chloride SA (K-DUR,KLOR-CON) 20 MEQ tablet Take 20 mEq by mouth daily.  . predniSONE (STERAPRED UNI-PAK) 10 MG tablet Take by mouth daily. Taper pack  . ZOLOFT 50 MG tablet TAKE 1 TABLET BY MOUTH DAILY BEFORE BREAKFAST  . CELEBREX 200 MG capsule TAKE ONE CAPSULE BY MOUTH DAILY

## 2014-06-05 NOTE — Assessment & Plan Note (Signed)
Showed bilateral pleural effusions immediately after surgery which considering the clinical context are most likely due to over aggressive IV fluid resuscitation. She had an echocardiogram today which was normal.  She appears to be improving on physical exam.  Plan: -Check a chest x-ray today -I instructed her to stop using Lasix as her oxygenation has improved significantly -I advised her to watch her weight very carefully and start taking Lasix again if her weight is up by more than 3 pounds in 24 hours.

## 2014-06-07 NOTE — Progress Notes (Signed)
Quick Note:  lmtcb X1 to relay results. ______ 

## 2014-06-12 ENCOUNTER — Telehealth: Payer: Self-pay | Admitting: Pulmonary Disease

## 2014-06-12 DIAGNOSIS — J449 Chronic obstructive pulmonary disease, unspecified: Secondary | ICD-10-CM

## 2014-06-12 NOTE — Telephone Encounter (Signed)
Tell her that I still want her to use it at night, but we can check an ONO on room air.  If that is normal then we can send order to d/c

## 2014-06-12 NOTE — Telephone Encounter (Signed)
I spoke with the pt and she is asking for an order to be sent to [facility] to d/c oxygen. This was ordered while she was in the hospital. She is not using the oxygen at this time, states her oxygen levels have been fine. Please advise. [place] Bing, CMA

## 2014-06-13 ENCOUNTER — Encounter: Payer: Self-pay | Admitting: Pulmonary Disease

## 2014-06-13 NOTE — Telephone Encounter (Signed)
Pt aware, order placed. [place] Bing, CMA

## 2014-06-19 NOTE — Progress Notes (Signed)
Quick Note:  lmtcb X2 to relay results. ______

## 2014-06-28 NOTE — Progress Notes (Signed)
Quick Note:  Spoke with pt, she is aware of results and recs. Nothing further needed. ______ 

## 2014-07-03 ENCOUNTER — Encounter: Payer: Self-pay | Admitting: Pulmonary Disease

## 2014-07-04 ENCOUNTER — Telehealth: Payer: Self-pay

## 2014-07-04 NOTE — Telephone Encounter (Signed)
Message copied by Len Blalock on Wed Jul 04, 2014  2:02 PM ------      Message from: Juanito Doom      Created: Tue Jul 03, 2014  9:02 PM       A,      Please let her know that her ONO was normal      Thanks      B ------

## 2014-07-04 NOTE — Telephone Encounter (Signed)
Spoke with pt, she is aware of results and recs.  Nothing further needed. 

## 2014-07-05 ENCOUNTER — Encounter: Payer: Self-pay | Admitting: Pulmonary Disease

## 2014-07-05 ENCOUNTER — Ambulatory Visit (INDEPENDENT_AMBULATORY_CARE_PROVIDER_SITE_OTHER): Payer: Medicare Other | Admitting: Pulmonary Disease

## 2014-07-05 ENCOUNTER — Ambulatory Visit (INDEPENDENT_AMBULATORY_CARE_PROVIDER_SITE_OTHER)
Admission: RE | Admit: 2014-07-05 | Discharge: 2014-07-05 | Disposition: A | Discharge status: Home / Self Care | Payer: Medicare Other | Source: Ambulatory Visit | Attending: Pulmonary Disease | Admitting: Pulmonary Disease

## 2014-07-05 VITALS — BP 118/70 | HR 69 | Ht 63.0 in | Wt 172.0 lb

## 2014-07-05 DIAGNOSIS — J90 Pleural effusion, not elsewhere classified: Secondary | ICD-10-CM

## 2014-07-05 DIAGNOSIS — J9610 Chronic respiratory failure, unspecified whether with hypoxia or hypercapnia: Secondary | ICD-10-CM

## 2014-07-05 DIAGNOSIS — J438 Other emphysema: Secondary | ICD-10-CM

## 2014-07-05 DIAGNOSIS — R911 Solitary pulmonary nodule: Secondary | ICD-10-CM

## 2014-07-05 DIAGNOSIS — Z23 Encounter for immunization: Secondary | ICD-10-CM

## 2014-07-05 DIAGNOSIS — R0902 Hypoxemia: Secondary | ICD-10-CM

## 2014-07-05 DIAGNOSIS — J9611 Chronic respiratory failure with hypoxia: Secondary | ICD-10-CM

## 2014-07-05 NOTE — Patient Instructions (Signed)
Go to the [facility] office for a chest x-ray We will try to get the CD of your CT Scan from [facility] Take Anoro once per day INSTEAD of Breo for a week Stay as active as possible We will see you back in 6 months or sooner if needed

## 2014-07-05 NOTE — Assessment & Plan Note (Signed)
This was likely due to volume overload while hospitalized. Her lung exam today is normal so I suspect this has cleared up. However, I would like to get a chest x-ray to make sure.

## 2014-07-05 NOTE — Assessment & Plan Note (Signed)
We have discontinued her oxygen as this problem has resolved.

## 2014-07-05 NOTE — Progress Notes (Signed)
Friday the segments the Subjective:    Patient ID: Shannon [name], female    DOB: [birth year], 68 y.o.   MRN: [id number]  Synopsis: Shannon [name] was evaluated by [place] [facility] in November 2013 for dyspnea and fatigue.  She smoked 1.5 pack of cigarettes daily for at least 20 years and quit in 2005. She was diagnosed with breast cancer in 2010 and treated with a mastectomy, chemo/xrt.  She had simple spirometry consistent with obstruction and was started on Spiriva and did well.  HPI  07/05/2014 ROV > Shannon [name] is hanging in there despite all the pain from her back.  She is really limited from pain, weakness in her legs.  She can't stand for a long period of time.  She gets tired after trying to walk.  She feels like her cough isn't too bad. She continues to take the [facility]. She wonders if her COPD has worsened. However, she does not have increasing chest tightness, wheezing, or mucus production. In fact, she does not cough up any mucus or have much of a cough at all. She has not been smoking. She plans to go on a cruise with her husband in about 5 weeks. She continues to take the [place] daily.  Past Medical History  Diagnosis Date  . Thyroid disease     HYPO  . Restless leg syndrome   . Hx: UTI (urinary tract infection)   . Diverticulitis   . Anemia   . Reiter's disease     DIAGNOSED YEARS AGO IN [place] BY RHEUMATOLOGY  . HLD (hyperlipidemia)   . GERD (gastroesophageal reflux disease)   . Neuromuscular disorder   . Hearing loss   . Nasal congestion   . Abdominal pain   . 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 Hypertension   . Complication of anesthesia 1977    states stops breathing post op requring  "step down unit" care x 4 days  . PONV (postoperative nausea and vomiting) 2010  . 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 COPD (chronic obstructive pulmonary disease)   . Stroke 2010    TIA      Review of Systems  Constitutional: Positive for fatigue. Negative for fever and  chills.  HENT: Negative for postnasal drip, rhinorrhea and sinus pressure.   Respiratory: Positive for shortness of breath. Negative for cough and wheezing.   Cardiovascular: Negative for chest pain, palpitations and leg swelling.       Objective:   Physical Exam   Filed Vitals:   07/05/14 1018  BP: 118/70  Pulse: 69  Height: 5\' 3"  (1.6 m)  Weight: 172 lb (78.019 kg)  SpO2: 100%  RA  Gen: well appearing, no acute distress HEENT: NCAT, EOMi, OP clear PULM: Clear to auscultation today CV: RRR, slight systolic murmur, no JVD AB: BS+, soft, nontender, no hsm Ext: warm, no edema, no clubbing, no cyanosis  November 2013 simple spirometry showed obstruction on the flow volume loop FEV1 was greater than 80% predicted     Assessment & Plan:   COPD (chronic obstructive pulmonary disease) Shannon [name] continues to struggle with shortness of breath but I don't think it has anything to do with COPD. I think it is most likely due to deconditioning from all the trouble she's had since her back surgery. Today on physical exam she is not wheezing. We do need to get another chest x-ray to make sure the pleural effusions cleared up.  Plan: -We  will try a trial of Anoro for a week and she will let me know how she does with that -Flu shot given today  Solitary pulmonary nodule So far the nodule has been stable in 2015. She does not need another CT chest until January 2017. I will continue to try to get the images from August 2015 from [place] just to review to see if there had been any interval change.  Pleural effusion This was likely due to volume overload while hospitalized. Her lung exam today is normal so I suspect this has cleared up. However, I would like to get a chest x-ray to make sure.  Chronic hypoxemic respiratory failure We have discontinued her oxygen as this problem has resolved.    Updated Medication List Outpatient Encounter Prescriptions as of 07/05/2014  Medication Sig  .  anastrozole (ARIMIDEX) 1 MG tablet Take 1 tablet (1 mg total) by mouth daily.  Marland Kitchen aspirin 81 MG tablet Take 81 mg by mouth daily.   . cyclobenzaprine (FLEXERIL) 10 MG tablet Take 10 mg by mouth daily as needed. Muscle spasms  . Fluticasone Furoate-Vilanterol (BREO ELLIPTA) 100-25 MCG/INH AEPB Inhale 1 puff into the lungs daily.  Marland Kitchen gabapentin (NEURONTIN) 100 MG capsule Take 100 mg by mouth 3 (three) times daily.  . hydrochlorothiazide (MICROZIDE) 12.5 MG capsule TAKE ONE (1) CAPSULE BY MOUTH EACH DAY BEFORE BREAKFAST  . levothyroxine (SYNTHROID, LEVOTHROID) 100 MCG tablet Take 100 mcg by mouth every evening.   Marland Kitchen oxyCODONE (OXY IR/ROXICODONE) 5 MG immediate release tablet Take 1 tablet by mouth 3 (three) times daily as needed.   Marland Kitchen ZOLOFT 50 MG tablet TAKE 1 TABLET BY MOUTH DAILY BEFORE BREAKFAST  . CELEBREX 200 MG capsule TAKE ONE CAPSULE BY MOUTH DAILY  . [DISCONTINUED] furosemide (LASIX) 40 MG tablet Take 40 mg by mouth daily.  . [DISCONTINUED] potassium chloride SA (K-DUR,KLOR-CON) 20 MEQ tablet Take 20 mEq by mouth daily.  . [DISCONTINUED] predniSONE (STERAPRED UNI-PAK) 10 MG tablet Take by mouth daily. Taper pack

## 2014-07-05 NOTE — Assessment & Plan Note (Signed)
Shannon [name] continues to struggle with shortness of breath but I don't think it has anything to do with COPD. I think it is most likely due to deconditioning from all the trouble she's had since her back surgery. Today on physical exam she is not wheezing. We do need to get another chest x-ray to make sure the pleural effusions cleared up.  Plan: -We will try a trial of Anoro for a week and she will let me know how she does with that -Flu shot given today

## 2014-07-05 NOTE — Assessment & Plan Note (Signed)
So far the nodule has been stable in 2015. She does not need another CT chest until January 2017. I will continue to try to get the images from August 2015 from [place] just to review to see if there had been any interval change.

## 2014-07-06 ENCOUNTER — Telehealth: Payer: Self-pay | Admitting: Pulmonary Disease

## 2014-07-06 NOTE — Progress Notes (Signed)
Quick Note:  lmtcb X1 to relay results. ______ 

## 2014-07-06 NOTE — Progress Notes (Signed)
Quick Note:  Spoke with pt, she is aware of results and recs. Nothing further needed. ______ 

## 2014-07-06 NOTE — Telephone Encounter (Signed)
Shannon [name],  Please let her noted that this was normal  Thanks, Ruby Cola (in regard to recent chest x-ray) ---------------------------------------------------------  Spoke with pt, she is aware of results and recs.  Nothing further needed.

## 2014-07-09 ENCOUNTER — Telehealth: Payer: Self-pay | Admitting: Pulmonary Disease

## 2014-07-09 DIAGNOSIS — J438 Other emphysema: Secondary | ICD-10-CM

## 2014-07-09 NOTE — Telephone Encounter (Signed)
Per 07/05/14 OV; We have discontinued her oxygen as this problem has resolved   No order was placed. I have done so. Nothing further needed

## 2014-08-03 ENCOUNTER — Other Ambulatory Visit: Payer: Self-pay | Admitting: Internal Medicine

## 2014-08-03 ENCOUNTER — Other Ambulatory Visit: Payer: Self-pay | Admitting: *Deleted

## 2014-08-07 ENCOUNTER — Ambulatory Visit (INDEPENDENT_AMBULATORY_CARE_PROVIDER_SITE_OTHER): Payer: Medicare Other | Admitting: Family Medicine

## 2014-08-07 ENCOUNTER — Telehealth: Payer: Self-pay | Admitting: Family Medicine

## 2014-08-07 ENCOUNTER — Inpatient Hospital Stay (HOSPITAL_COMMUNITY)
Admission: EM | Admit: 2014-08-07 | Discharge: 2014-08-11 | DRG: 389 | Disposition: A | Discharge status: Home / Self Care | Payer: Medicare Other | Attending: Internal Medicine | Admitting: Internal Medicine

## 2014-08-07 ENCOUNTER — Encounter (HOSPITAL_COMMUNITY): Payer: Self-pay | Admitting: Emergency Medicine

## 2014-08-07 ENCOUNTER — Encounter: Payer: Self-pay | Admitting: Family Medicine

## 2014-08-07 ENCOUNTER — Ambulatory Visit (INDEPENDENT_AMBULATORY_CARE_PROVIDER_SITE_OTHER)
Admission: RE | Admit: 2014-08-07 | Discharge: 2014-08-07 | Disposition: A | Discharge status: Home / Self Care | Payer: Medicare Other | Source: Ambulatory Visit | Attending: Family Medicine | Admitting: Family Medicine

## 2014-08-07 VITALS — BP 120/90 | HR 90 | Temp 98.4°F | Ht 63.0 in | Wt 170.2 lb

## 2014-08-07 DIAGNOSIS — G2581 Restless legs syndrome: Secondary | ICD-10-CM | POA: Diagnosis present

## 2014-08-07 DIAGNOSIS — Z79899 Other long term (current) drug therapy: Secondary | ICD-10-CM | POA: Diagnosis not present

## 2014-08-07 DIAGNOSIS — K56600 Partial intestinal obstruction, unspecified as to cause: Secondary | ICD-10-CM

## 2014-08-07 DIAGNOSIS — Z87891 Personal history of nicotine dependence: Secondary | ICD-10-CM

## 2014-08-07 DIAGNOSIS — R0609 Other forms of dyspnea: Secondary | ICD-10-CM

## 2014-08-07 DIAGNOSIS — E038 Other specified hypothyroidism: Secondary | ICD-10-CM

## 2014-08-07 DIAGNOSIS — R1084 Generalized abdominal pain: Secondary | ICD-10-CM | POA: Diagnosis not present

## 2014-08-07 DIAGNOSIS — J90 Pleural effusion, not elsewhere classified: Secondary | ICD-10-CM

## 2014-08-07 DIAGNOSIS — R1031 Right lower quadrant pain: Secondary | ICD-10-CM

## 2014-08-07 DIAGNOSIS — E876 Hypokalemia: Secondary | ICD-10-CM | POA: Diagnosis present

## 2014-08-07 DIAGNOSIS — M5416 Radiculopathy, lumbar region: Secondary | ICD-10-CM

## 2014-08-07 DIAGNOSIS — E039 Hypothyroidism, unspecified: Secondary | ICD-10-CM

## 2014-08-07 DIAGNOSIS — R030 Elevated blood-pressure reading, without diagnosis of hypertension: Secondary | ICD-10-CM

## 2014-08-07 DIAGNOSIS — F329 Major depressive disorder, single episode, unspecified: Secondary | ICD-10-CM

## 2014-08-07 DIAGNOSIS — E785 Hyperlipidemia, unspecified: Secondary | ICD-10-CM | POA: Diagnosis present

## 2014-08-07 DIAGNOSIS — I10 Essential (primary) hypertension: Secondary | ICD-10-CM | POA: Diagnosis present

## 2014-08-07 DIAGNOSIS — H9190 Unspecified hearing loss, unspecified ear: Secondary | ICD-10-CM | POA: Diagnosis present

## 2014-08-07 DIAGNOSIS — Z7982 Long term (current) use of aspirin: Secondary | ICD-10-CM | POA: Diagnosis not present

## 2014-08-07 DIAGNOSIS — K219 Gastro-esophageal reflux disease without esophagitis: Secondary | ICD-10-CM | POA: Diagnosis present

## 2014-08-07 DIAGNOSIS — R209 Unspecified disturbances of skin sensation: Secondary | ICD-10-CM

## 2014-08-07 DIAGNOSIS — IMO0001 Reserved for inherently not codable concepts without codable children: Secondary | ICD-10-CM

## 2014-08-07 DIAGNOSIS — Z853 Personal history of malignant neoplasm of breast: Secondary | ICD-10-CM | POA: Diagnosis not present

## 2014-08-07 DIAGNOSIS — K56609 Unspecified intestinal obstruction, unspecified as to partial versus complete obstruction: Secondary | ICD-10-CM | POA: Diagnosis present

## 2014-08-07 DIAGNOSIS — R911 Solitary pulmonary nodule: Secondary | ICD-10-CM

## 2014-08-07 DIAGNOSIS — G629 Polyneuropathy, unspecified: Secondary | ICD-10-CM

## 2014-08-07 DIAGNOSIS — M0230 Reiter's disease, unspecified site: Secondary | ICD-10-CM | POA: Diagnosis present

## 2014-08-07 DIAGNOSIS — F32A Depression, unspecified: Secondary | ICD-10-CM

## 2014-08-07 DIAGNOSIS — Z8673 Personal history of transient ischemic attack (TIA), and cerebral infarction without residual deficits: Secondary | ICD-10-CM

## 2014-08-07 DIAGNOSIS — J449 Chronic obstructive pulmonary disease, unspecified: Secondary | ICD-10-CM | POA: Diagnosis present

## 2014-08-07 DIAGNOSIS — K565 Intestinal adhesions [bands] with obstruction (postprocedural) (postinfection): Principal | ICD-10-CM | POA: Diagnosis present

## 2014-08-07 DIAGNOSIS — M5136 Other intervertebral disc degeneration, lumbar region: Secondary | ICD-10-CM

## 2014-08-07 DIAGNOSIS — M51369 Other intervertebral disc degeneration, lumbar region without mention of lumbar back pain or lower extremity pain: Secondary | ICD-10-CM

## 2014-08-07 DIAGNOSIS — K5669 Other intestinal obstruction: Secondary | ICD-10-CM

## 2014-08-07 LAB — CBC WITH DIFFERENTIAL/PLATELET
BASOS PCT: 0.1 % (ref 0.0–3.0)
Basophils Absolute: 0 10*3/uL (ref 0.0–0.1)
Eosinophils Absolute: 0.1 10*3/uL (ref 0.0–0.7)
Eosinophils Relative: 0.9 % (ref 0.0–5.0)
HCT: 41.9 % (ref 36.0–46.0)
HEMOGLOBIN: 13.8 g/dL (ref 12.0–15.0)
LYMPHS PCT: 24.2 % (ref 12.0–46.0)
Lymphs Abs: 2.7 10*3/uL (ref 0.7–4.0)
MCHC: 33 g/dL (ref 30.0–36.0)
MCV: 97.2 fl (ref 78.0–100.0)
Monocytes Absolute: 0.9 10*3/uL (ref 0.1–1.0)
Monocytes Relative: 8.3 % (ref 3.0–12.0)
NEUTROS ABS: 7.4 10*3/uL (ref 1.4–7.7)
Neutrophils Relative %: 66.5 % (ref 43.0–77.0)
Platelets: 307 10*3/uL (ref 150.0–400.0)
RBC: 4.31 Mil/uL (ref 3.87–5.11)
RDW: 14.4 % (ref 11.5–15.5)
WBC: 11.1 10*3/uL — ABNORMAL HIGH (ref 4.0–10.5)

## 2014-08-07 LAB — COMPREHENSIVE METABOLIC PANEL
ALK PHOS: 86 U/L (ref 39–117)
ALT: 12 U/L (ref 0–35)
AST: 22 U/L (ref 0–37)
Albumin: 3.4 g/dL — ABNORMAL LOW (ref 3.5–5.2)
BILIRUBIN TOTAL: 1.1 mg/dL (ref 0.2–1.2)
BUN: 16 mg/dL (ref 6–23)
CALCIUM: 9.6 mg/dL (ref 8.4–10.5)
CHLORIDE: 102 meq/L (ref 96–112)
CO2: 31 mEq/L (ref 19–32)
Creatinine, Ser: 0.9 mg/dL (ref 0.4–1.2)
GFR: 65.48 mL/min (ref >60.00)
Glucose, Bld: 125 mg/dL — ABNORMAL HIGH (ref 70–99)
Potassium: 3.9 mEq/L (ref 3.5–5.1)
Sodium: 138 mEq/L (ref 135–145)
Total Protein: 8.2 g/dL (ref 6.0–8.3)

## 2014-08-07 LAB — TSH: TSH: 1.1 u[IU]/mL (ref 0.350–4.500)

## 2014-08-07 MED ORDER — ONDANSETRON HCL 4 MG/2ML IJ SOLN: 4.0000 mg | Freq: Four times a day (QID) | INTRAMUSCULAR | Status: DC | PRN

## 2014-08-07 MED ORDER — FOLIC ACID 1 MG PO TABS
1.0000 mg | ORAL_TABLET | Freq: Every day | ORAL | Status: DC
  Administered 2014-08-08 – 2014-08-11 (×4): 1 mg via ORAL
  Filled 2014-08-07 (×4): qty 1

## 2014-08-07 MED ORDER — SERTRALINE HCL 50 MG PO TABS
50.0000 mg | ORAL_TABLET | Freq: Every morning | ORAL | Status: DC
  Administered 2014-08-08 – 2014-08-11 (×4): 50 mg via ORAL
  Filled 2014-08-07 (×4): qty 1

## 2014-08-07 MED ORDER — ASPIRIN 81 MG PO CHEW
81.0000 mg | CHEWABLE_TABLET | Freq: Every day | ORAL | Status: DC
  Administered 2014-08-08 – 2014-08-11 (×4): 81 mg via ORAL
  Filled 2014-08-07 (×4): qty 1

## 2014-08-07 MED ORDER — CYCLOBENZAPRINE HCL 10 MG PO TABS: 10.0000 mg | ORAL_TABLET | Freq: Every day | ORAL | Status: DC | PRN

## 2014-08-07 MED ORDER — ONDANSETRON HCL 4 MG PO TABS: 4.0000 mg | ORAL_TABLET | Freq: Four times a day (QID) | ORAL | Status: DC | PRN

## 2014-08-07 MED ORDER — GABAPENTIN 300 MG PO CAPS
300.0000 mg | ORAL_CAPSULE | Freq: Every day | ORAL | Status: DC
  Administered 2014-08-07 – 2014-08-10 (×4): 300 mg via ORAL
  Filled 2014-08-07 (×6): qty 1

## 2014-08-07 MED ORDER — ADULT MULTIVITAMIN W/MINERALS CH
1.0000 | ORAL_TABLET | Freq: Every day | ORAL | Status: DC
  Administered 2014-08-08 – 2014-08-11 (×4): 1 via ORAL
  Filled 2014-08-07 (×4): qty 1

## 2014-08-07 MED ORDER — VITAMIN B-1 100 MG PO TABS
100.0000 mg | ORAL_TABLET | Freq: Every day | ORAL | Status: DC
  Administered 2014-08-08 – 2014-08-11 (×4): 100 mg via ORAL
  Filled 2014-08-07 (×4): qty 1

## 2014-08-07 MED ORDER — FLUTICASONE FUROATE-VILANTEROL 100-25 MCG/INH IN AEPB: 1.0000 | INHALATION_SPRAY | Freq: Every day | RESPIRATORY_TRACT | Status: DC

## 2014-08-07 MED ORDER — ASPIRIN 81 MG PO TABS: 81.0000 mg | ORAL_TABLET | Freq: Every day | ORAL | Status: DC

## 2014-08-07 MED ORDER — OXYCODONE HCL 5 MG PO TABS
10.0000 mg | ORAL_TABLET | Freq: Every evening | ORAL | Status: DC | PRN
  Administered 2014-08-07 – 2014-08-10 (×4): 10 mg via ORAL
  Filled 2014-08-07 (×4): qty 2

## 2014-08-07 MED ORDER — MORPHINE SULFATE 2 MG/ML IJ SOLN: 2.0000 mg | INTRAMUSCULAR | Status: DC | PRN

## 2014-08-07 MED ORDER — DEXTROSE-NACL 5-0.9 % IV SOLN
INTRAVENOUS | Status: DC
  Administered 2014-08-07 – 2014-08-09 (×2): via INTRAVENOUS

## 2014-08-07 MED ORDER — ZOLPIDEM TARTRATE 5 MG PO TABS: 5.0000 mg | ORAL_TABLET | Freq: Every evening | ORAL | Status: DC | PRN

## 2014-08-07 MED ORDER — LEVOTHYROXINE SODIUM 100 MCG PO TABS
100.0000 ug | ORAL_TABLET | Freq: Every day | ORAL | Status: DC
  Administered 2014-08-08: 100 ug via ORAL
  Filled 2014-08-07 (×3): qty 1

## 2014-08-07 MED ORDER — FLUTICASONE FUROATE-VILANTEROL 100-25 MCG/INH IN AEPB
1.0000 | INHALATION_SPRAY | Freq: Every day | RESPIRATORY_TRACT | Status: DC
  Administered 2014-08-08: 1 via RESPIRATORY_TRACT

## 2014-08-07 MED ORDER — IOHEXOL 300 MG/ML  SOLN
100.0000 mL | Freq: Once | INTRAMUSCULAR | Status: AC | PRN
  Administered 2014-08-07: 100 mL via INTRAVENOUS

## 2014-08-07 MED ORDER — HYDROCHLOROTHIAZIDE 12.5 MG PO CAPS
12.5000 mg | ORAL_CAPSULE | Freq: Every day | ORAL | Status: DC
  Administered 2014-08-08 – 2014-08-11 (×4): 12.5 mg via ORAL
  Filled 2014-08-07 (×4): qty 1

## 2014-08-07 NOTE — Assessment & Plan Note (Signed)
With rebound, concerning for peritonitis. Otherwise pt appears nontoxic.  not typical viral GE given pain started first and continuing.  Will eval for appendicitis or adhesions, obstruction etc with Ab pelvic CT with contrast, CMET and CBC.

## 2014-08-07 NOTE — ED Provider Notes (Signed)
CSN: [id number]     Arrival date & time 08/07/14  1626 History   First MD Initiated Contact with Patient 08/07/14 1722     Chief Complaint  Patient presents with  . Abdominal Pain     (Consider location/radiation/quality/duration/timing/severity/associated sxs/prior Treatment) Patient is a 68 y.o. female presenting with abdominal pain.  Abdominal Pain Pain location:  Generalized Pain quality: cramping   Pain radiates to:  Does not radiate Pain severity:  Severe Onset quality:  Gradual Duration:  3 days Timing:  Constant Progression:  Worsening Chronicity:  New Context: previous surgery (multiple prior bowel surgeries)   Relieved by:  Nothing Worsened by:  Nothing tried Ineffective treatments:  None tried Associated symptoms: anorexia, chills, diarrhea, nausea and vomiting   Associated symptoms: no fever     Past Medical History  Diagnosis Date  . Thyroid disease     HYPO  . Restless leg syndrome   . Hx: UTI (urinary tract infection)   . Diverticulitis   . Anemia   . Reiter's disease     DIAGNOSED YEARS AGO IN [place] BY RHEUMATOLOGY  . HLD (hyperlipidemia)   . GERD (gastroesophageal reflux disease)   . Neuromuscular disorder   . Hearing loss   . Nasal congestion   . Abdominal pain   . 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 Hypertension   . Complication of anesthesia 1977    states stops breathing post op requring  "step down unit" care x 4 days  . PONV (postoperative nausea and vomiting) 2010  . 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 COPD (chronic obstructive pulmonary disease)   . Stroke 2010    TIA   Past Surgical History  Procedure Laterality Date  . Ovary surgery  2006  . Abdominal hysterectomy  1976  . Cholecystectomy  2008  . Hernia repair  2010  . Tram      right tram flap  . Breast surgery  2009    right mastectomy sent node biopsy  . Breast reconstruction      right tram, left implant and lift  . Eye surgery      RK  Bilateral  . Laparoscopy  11/14/2012    Procedure: LAPAROSCOPY DIAGNOSTIC;  Surgeon: Rolm Bookbinder, MD;  Location: [facility] ORS;  Service: General;  Laterality: N/A;   Family History  Problem Relation Age of Onset  . Lung cancer Mother     was a smoker  . Lung cancer Maternal Grandmother     never a smoker  . Emphysema Father     was a former smoker   History  Substance Use Topics  . Smoking status: Former Smoker -- 1.50 packs/day for 20 years    Types: Cigarettes    Quit date: 11/08/2003  . Smokeless tobacco: Never Used  . Alcohol Use: No     Comment: rarely   OB History   Grav Para Term Preterm Abortions TAB SAB Ect Mult Living                 Review of Systems  Constitutional: Positive for chills. Negative for fever.  Gastrointestinal: Positive for nausea, vomiting, abdominal pain, diarrhea and anorexia.  All other systems reviewed and are negative.     Allergies  Review of patient's allergies indicates no known allergies.  Home Medications   Prior to Admission medications   Medication Sig Start Date End Date Taking? Authorizing Provider  aspirin 81 MG tablet Take  81 mg by mouth daily.    Yes Historical Provider, MD  celecoxib (CELEBREX) 200 MG capsule Take 200 mg by mouth daily.   Yes Historical Provider, MD  cyclobenzaprine (FLEXERIL) 10 MG tablet Take 10 mg by mouth daily as needed. Muscle spasms   Yes Historical Provider, MD  Fluticasone Furoate-Vilanterol (BREO ELLIPTA) 100-25 MCG/INH AEPB Inhale 1 puff into the lungs daily. 05/30/14  Yes Juanito Doom, MD  gabapentin (NEURONTIN) 100 MG capsule Take 300 mg by mouth at bedtime.    Yes Historical Provider, MD  hydrochlorothiazide (MICROZIDE) 12.5 MG capsule Take 12.5 mg by mouth daily.   Yes Historical Provider, MD  levothyroxine (SYNTHROID, LEVOTHROID) 100 MCG tablet Take 100 mcg by mouth every evening.    Yes Historical Provider, MD  oxyCODONE (OXY IR/ROXICODONE) 5 MG immediate release tablet Take 10 mg by  mouth at bedtime as needed for moderate pain.  04/26/13  Yes Historical Provider, MD  sertraline (ZOLOFT) 50 MG tablet Take 50 mg by mouth every morning.   Yes Historical Provider, MD   BP 132/61  Pulse 64  Temp(Src) 98.1 F (36.7 C) (Oral)  Resp 17  Ht 5\' 3"  (1.6 m)  Wt 170 lb (77.111 kg)  BMI 30.12 kg/m2  SpO2 96% Physical Exam  Vitals reviewed. Constitutional: She is oriented to person, place, and time. She appears well-developed and well-nourished.  HENT:  Head: Normocephalic and atraumatic.  Right Ear: External ear normal.  Left Ear: External ear normal.  Eyes: Conjunctivae and EOM are normal. Pupils are equal, round, and reactive to light.  Neck: Normal range of motion. Neck supple.  Cardiovascular: Normal rate, regular rhythm, normal heart sounds and intact distal pulses.   Pulmonary/Chest: Effort normal and breath sounds normal.  Abdominal: Soft. Bowel sounds are normal. There is generalized tenderness.  Musculoskeletal: Normal range of motion.  Neurological: She is alert and oriented to person, place, and time.  Skin: Skin is warm and dry.    ED Course  Procedures (including critical care time) Labs Review Labs Reviewed - No data to display  Imaging Review Ct Abdomen Pelvis W Contrast  08/07/2014   CLINICAL DATA:  Right lower quadrant pain for a few days, worsening every day. History of breast cancer.  EXAM: CT ABDOMEN AND PELVIS WITH CONTRAST  TECHNIQUE: Multidetector CT imaging of the abdomen and pelvis was performed using the standard protocol following bolus administration of intravenous contrast.  CONTRAST:  100 mL OMNIPAQUE IOHEXOL 300 MG/ML  SOLN  COMPARISON:  CT abdomen and pelvis 05/23/2012.  FINDINGS: Breast implant on the left and reconstruction on the right are noted. Lung bases are clear. No pleural or pericardial effusion. Heart size is normal.  The patient is status post cholecystectomy. Mild prominence of the common bile duct is unchanged. The liver,  spleen, adrenal glands and pancreas appear normal. Small bilateral renal cysts are unchanged. Postoperative change is again seen in the right lower quadrant with surgical clips in the anterior abdominal wall and resection of the right rectus muscle noted.  The appendix is well visualized and normal in appearance. Small bowel loops are dilated at up to 3.2 cm with air-fluid levels present. Distal small bowel loops are completely decompressed. Findings are consistent with small bowel obstruction. Transition point is central in the low pelvis and likely due to adhesions. No pneumatosis, portal venous gas or free intraperitoneal air is identified. There is no lymphadenopathy.  The patient is status post anterior fusion at L5-S1. There is lucency about  both the screws in L5 and S1 most notable in the screw on the left in L5 and screw on the right in S1. The anterior, superior endplate of S1 appears partially fragmented.  IMPRESSION: Study is positive for small bowel obstruction which is likely due to adhesions. No evidence of ischemia is identified.  Status post L5-S1 fusion. Lucency about fusion screws with endplate sclerosis and fragmentation of the superior endplate of S1 are noted. Findings are most compatible with failed fusion.  Negative for appendicitis.  Findings were discussed with Dr. Diona Browner at the time of interpretation.   Electronically Signed   By: Inge Rise M.D.   On: 08/07/2014 16:04     EKG Interpretation None      MDM   Final diagnoses:  None    69 y.o. female with pertinent PMH of prior breast ca, lumbar stenosis, multiple prior abd surgeries presents with SBO from clinic.   Symptoms consistent with viral gastroenteritis for the past 3 days, however due to persistent abdominal tenderness a CT scan was obtained prior to ED visit which demonstrated small bowel obstruction.  On arrival vital signs and physical exam as above, patient not nauseous, exam. Consulted surgery who requested  a medical admission. Consulted hospitalist for admission.  Discussed with surgery the patient did not need a NG tube at this time.  Dx: SBO    Debby Freiberg, MD 08/07/14 [phone]

## 2014-08-07 NOTE — H&P (Signed)
[facility] History and Physical  Shannon [name] RSW:[id number] DOB: 08/05/1946 DOA: 08/07/2014  Referring physician: Debby Freiberg, MD PCP: Arnette Norris, MD   Chief Complaint: Abdominal Pain  HPI: Shannon [name] is a 68 y.o. female presents with abdominal pain. Patient states that on Sunday she noted some pain in her belly. She though initially that she had a viral flu and did not think too  Much about it. On Monday she states that she had some diarrhea and also had some vomiting. Today her pain became more severe. She states that she has had multiple surgeries in the past related to her abdominal but has never had a bowel obstruction. She had a CT scan done and this shows SOB which is felt to be likely due to adhesions. She does not have any other complaints at this time   Review of Systems:  Constitutional:  No weight loss, night sweats, Fevers, chills, fatigue.  HEENT:  No headaches, Difficulty swallowing  Cardio-vascular:  No chest pain, Orthopnea, PND, swelling in lower extremities  GI:  No heartburn, indigestion, ++abdominal pain, ++nausea, ++vomiting, ++diarrhea, ++loss of appetite  Resp:  No shortness of breath with exertion or at rest. Skin:  no rash or lesions.  GU:  no dysuria, change in color of urine, no urgency or frequency. Musculoskeletal:  No joint pain or swelling. No decreased range of motion Psych:  No change in mood or affect. No depression or anxiety. No memory loss.   Past Medical History  Diagnosis Date  . Thyroid disease     HYPO  . Restless leg syndrome   . Hx: UTI (urinary tract infection)   . Diverticulitis   . Anemia   . Reiter's disease     DIAGNOSED YEARS AGO IN [place] BY RHEUMATOLOGY  . HLD (hyperlipidemia)   . GERD (gastroesophageal reflux disease)   . Neuromuscular disorder   . Hearing loss   . Nasal congestion   . Abdominal pain   . 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 Hypertension   . Complication of  anesthesia 1977    states stops breathing post op requring  "step down unit" care x 4 days  . PONV (postoperative nausea and vomiting) 2010  . 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 COPD (chronic obstructive pulmonary disease)   . Stroke 2010    TIA   Past Surgical History  Procedure Laterality Date  . Ovary surgery  2006  . Abdominal hysterectomy  1976  . Cholecystectomy  2008  . Hernia repair  2010  . Tram      right tram flap  . Breast surgery  2009    right mastectomy sent node biopsy  . Breast reconstruction      right tram, left implant and lift  . Eye surgery      RK Bilateral  . Laparoscopy  11/14/2012    Procedure: LAPAROSCOPY DIAGNOSTIC;  Surgeon: Rolm Bookbinder, MD;  Location: [facility];  Service: General;  Laterality: N/A;   Social History:  reports that she quit smoking about 10 years ago. Her smoking use included Cigarettes. She has a 30 pack-year smoking history. She has never used smokeless tobacco. She reports that she does not drink alcohol or use illicit drugs.  No Known Allergies  Family History  Problem Relation Age of Onset  . Lung cancer Mother     was a smoker  . Lung cancer Maternal Grandmother  never a smoker  . Emphysema Father     was a former smoker     Prior to Admission medications   Medication Sig Start Date End Date Taking? Authorizing Provider  aspirin 81 MG tablet Take 81 mg by mouth daily.    Yes Historical Provider, MD  celecoxib (CELEBREX) 200 MG capsule Take 200 mg by mouth daily.   Yes Historical Provider, MD  cyclobenzaprine (FLEXERIL) 10 MG tablet Take 10 mg by mouth daily as needed. Muscle spasms   Yes Historical Provider, MD  Fluticasone Furoate-Vilanterol (BREO ELLIPTA) 100-25 MCG/INH AEPB Inhale 1 puff into the lungs daily. 05/30/14  Yes Juanito Doom, MD  gabapentin (NEURONTIN) 100 MG capsule Take 300 mg by mouth at bedtime.    Yes Historical Provider, MD  hydrochlorothiazide (MICROZIDE) 12.5 MG capsule  Take 12.5 mg by mouth daily.   Yes Historical Provider, MD  levothyroxine (SYNTHROID, LEVOTHROID) 100 MCG tablet Take 100 mcg by mouth every evening.    Yes Historical Provider, MD  oxyCODONE (OXY IR/ROXICODONE) 5 MG immediate release tablet Take 10 mg by mouth at bedtime as needed for moderate pain.  04/26/13  Yes Historical Provider, MD  sertraline (ZOLOFT) 50 MG tablet Take 50 mg by mouth every morning.   Yes Historical Provider, MD   Physical Exam: Filed Vitals:   08/07/14 1652 08/07/14 1845 08/07/14 1901 08/07/14 1921  BP: 162/98 132/61 129/86 146/93  Pulse: 77 64 67   Temp: 98.1 F (36.7 C)     TempSrc: Oral     Resp: 17 17 14 17   Height: 5\' 3"  (1.6 m)     Weight: 77.111 kg (170 lb)     SpO2: 96% 96% 98% 98%    Wt Readings from Last 3 Encounters:  08/07/14 77.111 kg (170 lb)  08/07/14 77.225 kg (170 lb 4 oz)  07/05/14 78.019 kg (172 lb)    General:  Appears calm and comfortable Eyes: PERRL, normal lids, irises & conjunctiva ENT: grossly normal hearing, lips & tongue Neck: no LAD, masses or thyromegaly Cardiovascular: RRR, no m/r/g. No LE edema. Respiratory: CTA bilaterally, no w/r/r. Normal respiratory effort. Abdomen: Distended tympanic with positive rebound Skin: no rash or induration seen on limited exam Musculoskeletal: grossly normal tone BUE/BLE Psychiatric: grossly normal mood and affect, speech fluent and appropriate Neurologic: grossly non-focal.          Labs on Admission:  Basic Metabolic Panel:  Recent Labs Lab 08/07/14 1143  NA 138  K 3.9  CL 102  CO2 31  GLUCOSE 125*  BUN 16  CREATININE 0.9  CALCIUM 9.6   Liver Function Tests:  Recent Labs Lab 08/07/14 1143  AST 22  ALT 12  ALKPHOS 86  BILITOT 1.1  PROT 8.2  ALBUMIN 3.4*   No results found for this basename: LIPASE, AMYLASE,  in the last 168 hours No results found for this basename: AMMONIA,  in the last 168 hours CBC:  Recent Labs Lab 08/07/14 1143  WBC 11.1*  NEUTROABS 7.4    HGB 13.8  HCT 41.9  MCV 97.2  PLT 307.0   Cardiac Enzymes: No results found for this basename: CKTOTAL, CKMB, CKMBINDEX, TROPONINI,  in the last 168 hours  BNP (last 3 results) No results found for this basename: PROBNP,  in the last 8760 hours CBG: No results found for this basename: GLUCAP,  in the last 168 hours  Radiological Exams on Admission: Ct Abdomen Pelvis W Contrast  08/07/2014   CLINICAL DATA:  Right lower quadrant pain for a few days, worsening every day. History of breast cancer.  EXAM: CT ABDOMEN AND PELVIS WITH CONTRAST  TECHNIQUE: Multidetector CT imaging of the abdomen and pelvis was performed using the standard protocol following bolus administration of intravenous contrast.  CONTRAST:  100 mL OMNIPAQUE IOHEXOL 300 MG/ML  SOLN  COMPARISON:  CT abdomen and pelvis 05/23/2012.  FINDINGS: Breast implant on the left and reconstruction on the right are noted. Lung bases are clear. No pleural or pericardial effusion. Heart size is normal.  The patient is status post cholecystectomy. Mild prominence of the common bile duct is unchanged. The liver, spleen, adrenal glands and pancreas appear normal. Small bilateral renal cysts are unchanged. Postoperative change is again seen in the right lower quadrant with surgical clips in the anterior abdominal wall and resection of the right rectus muscle noted.  The appendix is well visualized and normal in appearance. Small bowel loops are dilated at up to 3.2 cm with air-fluid levels present. Distal small bowel loops are completely decompressed. Findings are consistent with small bowel obstruction. Transition point is central in the low pelvis and likely due to adhesions. No pneumatosis, portal venous gas or free intraperitoneal air is identified. There is no lymphadenopathy.  The patient is status post anterior fusion at L5-S1. There is lucency about both the screws in L5 and S1 most notable in the screw on the left in L5 and screw on the right in  S1. The anterior, superior endplate of S1 appears partially fragmented.  IMPRESSION: Study is positive for small bowel obstruction which is likely due to adhesions. No evidence of ischemia is identified.  Status post L5-S1 fusion. Lucency about fusion screws with endplate sclerosis and fragmentation of the superior endplate of S1 are noted. Findings are most compatible with failed fusion.  Negative for appendicitis.  Findings were discussed with Dr. Diona Browner at the time of interpretation.   Electronically Signed   By: Inge Rise M.D.   On: 08/07/2014 16:04      Assessment/Plan Principal Problem:   Small bowel obstruction Active Problems:   Hypertension   COPD (chronic obstructive pulmonary disease)   Hypothyroid   SBO (small bowel obstruction)   1. Small Bowel Obstruction -she has significant history of prior surgeries and could have adhesions -General Surgery to see the patient -will keep NPO -I have spoken to surgery Dr Redmond Pulling who is in the OR and will see patient on floor  2. Hypertension -will continue monitoring pressure -home medications  3. COPD -appears to be mild at best by history -will continue with inhalers  4. Hypothyroid -will continue with synthroid -check TSH     Code Status: Full Code (must indicate code status--if unknown or must be presumed, indicate so) DVT Prophylaxis:SCDs Family Communication: Husband(indicate person spoken with, if applicable, with phone number if by telephone) Disposition Plan: Home (indicate anticipated LOS)  Time spent: 52min  Keith Cancio A [facility] Pager [phone]

## 2014-08-07 NOTE — Telephone Encounter (Signed)
Discussed abd CT results with radiologist. High grade small bowel obstruction. Recommended pt to go to ER ASAP for IV fluids and decompression. She is agreeable.   Radiologist also commented that  ? Screws at L5-S1 appear ? loose.  Discussed with pt. Pt just had surgery 6 weeks ago. Back doing well per Surgeon. Encouraged her to discuss CT with them when she follows up after abdominal issues resolved.

## 2014-08-07 NOTE — ED Notes (Signed)
Attempted report 

## 2014-08-07 NOTE — Consult Note (Signed)
Reason for Consult: bowel obstruction Referring Physician: Dr Tonna Corner Shannon [name] is an 68 y.o. female.  HPI: 68 yo female with h/o multiple prior abdominal surgeries came to ED earlier today with worsening abdominal pain after presenting to her PCP office for evaluation. She states she has some abdominal pain this Sunday. The pain was constant but increased with certain activities. It started on right mid abdomen and radiated to left side. Her pain is mainly like a band sensation in her abd. On Monday she had diarrhea and vomiting. Her pain worsened today so she came to the ED.  She has had several abdominal surgeries. She had back surgery at [facility] in early August thru a abdominal incision. In reading their op note, it sounds like they went transabdominal not extraperitoneal. She states she has had nausea ever since that surgery. 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 She had a diagnostic laparoscopy by Dr Donne Hazel in January 2014 for concerns of lower abdominal wall hernia but was just found to have a diastasis. She was noted to have some scar tissue to her prior epigastric ventral proceed mesh placed in 2011 by Dr Donne Hazel.  She reports her last stool was today and it was dark. Right now, resting comfortably with some abd pain, no emesis, not really nauseated. Feels a little bloated.    RADIOLOGICAL STUDIES: I have personally reviewed the radiological exams myself  Past Medical History  Diagnosis Date  . Thyroid disease     HYPO  . Restless leg syndrome   . Hx: UTI (urinary tract infection)   . Diverticulitis   . Anemia   . Reiter's disease     DIAGNOSED YEARS AGO IN [place] BY RHEUMATOLOGY  . HLD (hyperlipidemia)   . GERD (gastroesophageal reflux disease)   . Neuromuscular disorder   . Hearing loss   . Nasal congestion   . Abdominal pain   . 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 Hypertension   . Complication of anesthesia 1977    states stops  breathing post op requring  "step down unit" care x 4 days  . PONV (postoperative nausea and vomiting) 2010  . 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 COPD (chronic obstructive pulmonary disease)   . Stroke 2010    TIA    Past Surgical History  Procedure Laterality Date  . Ovary surgery  2006  . Abdominal hysterectomy  1976  . Cholecystectomy  2008  . Hernia repair  2010  . Tram      right tram flap  . Breast surgery  2009    right mastectomy sent node biopsy  . Breast reconstruction      right tram, left implant and lift  . Eye surgery      RK Bilateral  . Laparoscopy  11/14/2012    Procedure: LAPAROSCOPY DIAGNOSTIC;  Surgeon: Rolm Bookbinder, MD;  Location: [facility];  Service: General;  Laterality: N/A;    Family History  Problem Relation Age of Onset  . Lung cancer Mother     was a smoker  . Lung cancer Maternal Grandmother     never a smoker  . Emphysema Father     was a former smoker    Social History:  reports that she quit smoking about 10 years ago. Her smoking use included Cigarettes. She has a 30 pack-year smoking history. She has never used smokeless tobacco. She reports that she  does not drink alcohol or use illicit drugs.  Allergies: No Known Allergies  Medications: I have reviewed the patient's current medications.  Results for orders placed during the hospital encounter of 08/07/14 (from the past 48 hour(s))  TSH     Status: None   Collection Time    08/07/14  8:37 PM      Result Value Ref Range   TSH 1.100  0.350 - 4.500 uIU/mL    Ct Abdomen Pelvis W Contrast  08/07/2014   CLINICAL DATA:  Right lower quadrant pain for a few days, worsening every day. History of breast cancer.  EXAM: CT ABDOMEN AND PELVIS WITH CONTRAST  TECHNIQUE: Multidetector CT imaging of the abdomen and pelvis was performed using the standard protocol following bolus administration of intravenous contrast.  CONTRAST:  100 mL OMNIPAQUE IOHEXOL 300 MG/ML  SOLN   COMPARISON:  CT abdomen and pelvis 05/23/2012.  FINDINGS: Breast implant on the left and reconstruction on the right are noted. Lung bases are clear. No pleural or pericardial effusion. Heart size is normal.  The patient is status post cholecystectomy. Mild prominence of the common bile duct is unchanged. The liver, spleen, adrenal glands and pancreas appear normal. Small bilateral renal cysts are unchanged. Postoperative change is again seen in the right lower quadrant with surgical clips in the anterior abdominal wall and resection of the right rectus muscle noted.  The appendix is well visualized and normal in appearance. Small bowel loops are dilated at up to 3.2 cm with air-fluid levels present. Distal small bowel loops are completely decompressed. Findings are consistent with small bowel obstruction. Transition point is central in the low pelvis and likely due to adhesions. No pneumatosis, portal venous gas or free intraperitoneal air is identified. There is no lymphadenopathy.  The patient is status post anterior fusion at L5-S1. There is lucency about both the screws in L5 and S1 most notable in the screw on the left in L5 and screw on the right in S1. The anterior, superior endplate of S1 appears partially fragmented.  IMPRESSION: Study is positive for small bowel obstruction which is likely due to adhesions. No evidence of ischemia is identified.  Status post L5-S1 fusion. Lucency about fusion screws with endplate sclerosis and fragmentation of the superior endplate of S1 are noted. Findings are most compatible with failed fusion.  Negative for appendicitis.  Findings were discussed with Dr. Diona Browner at the time of interpretation.   Electronically Signed   By: Inge Rise M.D.   On: 08/07/2014 16:04    ROS Blood pressure 135/82, pulse 70, temperature 97.6 F (36.4 C), temperature source Oral, resp. rate 16, height 5\' 3"  (1.6 m), weight 169 lb 5 oz (76.8 kg), SpO2 96.00%. Physical Exam  Vitals  reviewed. Constitutional: She is oriented to person, place, and time. She appears well-developed and well-nourished. No distress.  HENT:  Head: Normocephalic and atraumatic.  Right Ear: External ear normal.  Left Ear: External ear normal.  Eyes: Conjunctivae are normal. No scleral icterus.  Neck: Normal range of motion. Neck supple. No tracheal deviation present. No thyromegaly present.  Cardiovascular: Normal rate and normal heart sounds.   Respiratory: Effort normal and breath sounds normal. No stridor. No respiratory distress. She has no wheezes.  GI: Soft. She exhibits distension (mild). There is tenderness in the right lower quadrant and periumbilical area. There is no rigidity, no rebound and no guarding. No hernia.    Musculoskeletal: She exhibits no edema and no tenderness.  Lymphadenopathy:  She has no cervical adenopathy.  Neurological: She is alert and oriented to person, place, and time. She exhibits normal muscle tone.  Skin: Skin is warm and dry. No rash noted. She is not diaphoretic. No erythema. No pallor.  Psychiatric: She has a normal mood and affect. Her behavior is normal. Judgment and thought content normal.    Assessment/Plan: pSBO HTN Hypothyroidism COPD H/o TIA H/o breast cancer  She is not febrile, tachycardiac. She does not have an elevated WBC. There is no radiological evidence of ischemic bowel. So I believe she does not have a surgical abdomen. Contrast does reach the colon. She doesn't appear uncomfortable. Will start with nonop mgmt. Explained that if she vomits tonight, has worsening pain, or if her intestines are still dilated on plain films in am, we will place NG tube.   She is supposed to go on a 17 day cruise starting next Tuesday to [place]. I explained that clearly that really is not an option at this point.   Leighton Ruff. Redmond Pulling, MD, FACS General, Bariatric, & Minimally Invasive Surgery [facility], [place]   [facility] M 08/07/2014,  10:58 PM

## 2014-08-07 NOTE — ED Notes (Signed)
Pt sent here for admission from Dr. Jola Baptist for abnormal CT showing SBO. Pt reports diarrhea yesterday ,but unsure when last normal BM. Pt reports 3/10 generalized abdominal pain. Reports N/V on Sunday that resolved yesterday. Reports mild fever Sunday night. Pt in NAD.

## 2014-08-07 NOTE — Progress Notes (Signed)
Subjective:    Patient ID: Shannon [name], female    DOB: Feb 14, 1946, 68 y.o.   MRN: [id number]  Abdominal Pain This is a new problem. The current episode started in the past 7 days (awoke with pain 3 days ago). The onset quality is sudden (pain started before everything). The problem occurs intermittently. The pain is located in the RUQ (radiates across central abdomen). The pain is moderate. The quality of the pain is sharp. The abdominal pain radiates to the periumbilical region. Associated symptoms include diarrhea, a fever, nausea and vomiting. Pertinent negatives include no anorexia, belching, constipation, dysuria, flatus, hematuria, melena or myalgias. Associated symptoms comments: V/D has improved now  She is chronically nauseous since back pain surgery 8/6, is on chronic narcotics. Subjective temp No blood in stool . The pain is aggravated by movement. The pain is relieved by recumbency (pepto bismol). The treatment provided mild relief.   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 10 total surgeries in last several years. Has been drinking fluids.  No recent antibiotics, no recent travel Last colonscopy:  nml except told tics present > 10 years ago.   Review of Systems  Constitutional: Positive for fever.  HENT: Negative for ear pain.   Eyes: Negative for pain.  Respiratory: Negative for shortness of breath.   Cardiovascular: Negative for chest pain and leg swelling.  Gastrointestinal: Positive for nausea, vomiting, abdominal pain and diarrhea. Negative for constipation, melena, anorexia and flatus.  Genitourinary: Positive for vaginal bleeding. Negative for dysuria, urgency, hematuria and difficulty urinating.  Musculoskeletal: Negative for myalgias.       Objective:   Physical Exam  Constitutional: Vital signs are normal. She appears well-developed and well-nourished. She is cooperative.   Non-toxic appearance. She does not appear ill. No distress.  Central obesity  HENT:  Head: Normocephalic.  Right Ear: Hearing, tympanic membrane, external ear and ear canal normal. Tympanic membrane is not erythematous, not retracted and not bulging.  Left Ear: Hearing, tympanic membrane, external ear and ear canal normal. Tympanic membrane is not erythematous, not retracted and not bulging.  Nose: No mucosal edema or rhinorrhea. Right sinus exhibits no maxillary sinus tenderness and no frontal sinus tenderness. Left sinus exhibits no maxillary sinus tenderness and no frontal sinus tenderness.  Mouth/Throat: Uvula is midline, oropharynx is clear and moist and mucous membranes are normal.  Eyes: Conjunctivae, EOM and lids are normal. Pupils are equal, round, and reactive to light. Lids are everted and swept, no foreign bodies found.  Neck: Trachea normal and normal range of motion. Neck supple. Carotid bruit is not present. No mass and no thyromegaly present.  Cardiovascular: Normal rate, regular rhythm, S1 normal, S2 normal, normal heart sounds, intact distal pulses and normal pulses.  Exam reveals no gallop and no friction rub.   No murmur heard. Pulmonary/Chest: Effort normal and breath sounds normal. Not tachypneic. No respiratory distress. She has no decreased breath sounds. She has no wheezes. She has no rhonchi. She has no rales.  Abdominal: Soft. Normal appearance and bowel sounds are normal. There is tenderness in the right lower quadrant, periumbilical area and suprapubic area. There is rebound and guarding. There is no rigidity and no CVA tenderness.  Neurological: She is alert.  Skin: Skin is warm, dry and intact. No rash noted.  Psychiatric: Her speech is normal and behavior is normal. Judgment and thought content normal. Her mood appears not anxious. Cognition and  memory are normal. She does not exhibit a depressed mood.          Assessment & Plan:

## 2014-08-07 NOTE — Progress Notes (Signed)
Pt admitted to the unit. Pt is alert and oriented. Pt oriented to room, staff, and call bell. Bed in lowest position. Full assessment to Epic. Call bell with in reach. Told to call for assists. Will continue to monitor.  Shannon [name]  

## 2014-08-07 NOTE — Patient Instructions (Signed)
Do not eat or drink until test. Stop at front desk to schedule test and go to lab for labs.  Go to ER if severe abdominal pain

## 2014-08-07 NOTE — Progress Notes (Signed)
Report received from [place], [place].

## 2014-08-07 NOTE — Progress Notes (Signed)
Pre visit review using our clinic review tool, if applicable. No additional management support is needed unless otherwise documented below in the visit note. 

## 2014-08-08 ENCOUNTER — Inpatient Hospital Stay (HOSPITAL_COMMUNITY): Payer: Medicare Other

## 2014-08-08 LAB — COMPREHENSIVE METABOLIC PANEL
ALBUMIN: 3.1 g/dL — AB (ref 3.5–5.2)
ALT: 9 U/L (ref 0–35)
ANION GAP: 13 (ref 5–15)
AST: 15 U/L (ref 0–37)
Alkaline Phosphatase: 87 U/L (ref 39–117)
BILIRUBIN TOTAL: 1.2 mg/dL (ref 0.3–1.2)
BUN: 12 mg/dL (ref 6–23)
CHLORIDE: 101 meq/L (ref 96–112)
CO2: 24 mEq/L (ref 19–32)
CREATININE: 0.6 mg/dL (ref 0.50–1.10)
Calcium: 9.2 mg/dL (ref 8.4–10.5)
GFR calc Af Amer: >90 mL/min (ref >90)
Glucose, Bld: 99 mg/dL (ref 70–99)
Potassium: 3.3 mEq/L — ABNORMAL LOW (ref 3.7–5.3)
Sodium: 138 mEq/L (ref 137–147)
Total Protein: 7.4 g/dL (ref 6.0–8.3)

## 2014-08-08 LAB — CBC
HEMATOCRIT: 37.5 % (ref 36.0–46.0)
Hemoglobin: 12.4 g/dL (ref 12.0–15.0)
MCH: 32.5 pg (ref 26.0–34.0)
MCHC: 33.1 g/dL (ref 30.0–36.0)
MCV: 98.2 fL (ref 78.0–100.0)
PLATELETS: 251 10*3/uL (ref 150–400)
RBC: 3.82 MIL/uL — AB (ref 3.87–5.11)
RDW: 13.7 % (ref 11.5–15.5)
WBC: 8.4 10*3/uL (ref 4.0–10.5)

## 2014-08-08 LAB — HEMOGLOBIN A1C
Hgb A1c MFr Bld: 5.6 % (ref <5.7)
Mean Plasma Glucose: 114 mg/dL (ref <117)

## 2014-08-08 LAB — GLUCOSE, CAPILLARY: Glucose-Capillary: 104 mg/dL — ABNORMAL HIGH (ref 70–99)

## 2014-08-08 MED ORDER — POTASSIUM CHLORIDE 10 MEQ/100ML IV SOLN
10.0000 meq | INTRAVENOUS | Status: AC
  Administered 2014-08-08 (×3): 10 meq via INTRAVENOUS
  Filled 2014-08-08 (×3): qty 100

## 2014-08-08 NOTE — Progress Notes (Signed)
PROGRESS NOTE  Shannon [name] HEN:[id number] DOB: August 15, 1946 DOA: 08/07/2014 PCP: Arnette Norris, MD  Assessment/Plan: Small Bowel Obstruction -she has significant history of prior surgeries and could have adhesions  -General Surgery folowing - NPO   Hypertension -will continue monitoring pressure  -home medications   COPD -appears to be mild at best by history  -will continue with inhalers   Hypothyroid -will continue with synthroid  -TSH ok  Hypokalemia -replete   Code Status: full Family Communication: patient Disposition Plan:    Consultants:  surgery  Procedures:      HPI/Subjective: abd sore Passing gas Dark stool  Objective: Filed Vitals:   08/08/14 1020  BP: 124/79  Pulse: 62  Temp:   Resp:     Intake/Output Summary (Last 24 hours) at 08/08/14 1146 Last data filed at 08/08/14 0600  Gross per 24 hour  Intake    400 ml  Output    300 ml  Net    100 ml   Filed Weights   08/07/14 1652 08/07/14 2012  Weight: 77.111 kg (170 lb) 76.8 kg (169 lb 5 oz)    Exam:   General:  A+Ox3, NAD  Cardiovascular: rrr  Respiratory: clear  Abdomen: no bowel sounds, mildly tender, mildly distended  Musculoskeletal: no edema   Data Reviewed: Basic Metabolic Panel:  Recent Labs Lab 08/07/14 1143 08/08/14 0530  NA 138 138  K 3.9 3.3*  CL 102 101  CO2 31 24  GLUCOSE 125* 99  BUN 16 12  CREATININE 0.9 0.60  CALCIUM 9.6 9.2   Liver Function Tests:  Recent Labs Lab 08/07/14 1143 08/08/14 0530  AST 22 15  ALT 12 9  ALKPHOS 86 87  BILITOT 1.1 1.2  PROT 8.2 7.4  ALBUMIN 3.4* 3.1*   No results found for this basename: LIPASE, AMYLASE,  in the last 168 hours No results found for this basename: AMMONIA,  in the last 168 hours CBC:  Recent Labs Lab 08/07/14 1143 08/08/14 0530  WBC 11.1* 8.4  NEUTROABS 7.4  --   HGB 13.8 12.4  HCT 41.9 37.5  MCV 97.2 98.2  PLT 307.0 251   Cardiac Enzymes: No results found for this basename:  CKTOTAL, CKMB, CKMBINDEX, TROPONINI,  in the last 168 hours BNP (last 3 results) No results found for this basename: PROBNP,  in the last 8760 hours CBG:  Recent Labs Lab 08/08/14 0752  GLUCAP 104*    No results found for this or any previous visit (from the past 240 hour(s)).   Studies: Ct Abdomen Pelvis W Contrast  08/07/2014   CLINICAL DATA:  Right lower quadrant pain for a few days, worsening every day. History of breast cancer.  EXAM: CT ABDOMEN AND PELVIS WITH CONTRAST  TECHNIQUE: Multidetector CT imaging of the abdomen and pelvis was performed using the standard protocol following bolus administration of intravenous contrast.  CONTRAST:  100 mL OMNIPAQUE IOHEXOL 300 MG/ML  SOLN  COMPARISON:  CT abdomen and pelvis 05/23/2012.  FINDINGS: Breast implant on the left and reconstruction on the right are noted. Lung bases are clear. No pleural or pericardial effusion. Heart size is normal.  The patient is status post cholecystectomy. Mild prominence of the common bile duct is unchanged. The liver, spleen, adrenal glands and pancreas appear normal. Small bilateral renal cysts are unchanged. Postoperative change is again seen in the right lower quadrant with surgical clips in the anterior abdominal wall and resection of the right rectus muscle noted.  The appendix is  well visualized and normal in appearance. Small bowel loops are dilated at up to 3.2 cm with air-fluid levels present. Distal small bowel loops are completely decompressed. Findings are consistent with small bowel obstruction. Transition point is central in the low pelvis and likely due to adhesions. No pneumatosis, portal venous gas or free intraperitoneal air is identified. There is no lymphadenopathy.  The patient is status post anterior fusion at L5-S1. There is lucency about both the screws in L5 and S1 most notable in the screw on the left in L5 and screw on the right in S1. The anterior, superior endplate of S1 appears partially  fragmented.  IMPRESSION: Study is positive for small bowel obstruction which is likely due to adhesions. No evidence of ischemia is identified.  Status post L5-S1 fusion. Lucency about fusion screws with endplate sclerosis and fragmentation of the superior endplate of S1 are noted. Findings are most compatible with failed fusion.  Negative for appendicitis.  Findings were discussed with Dr. Diona Browner at the time of interpretation.   Electronically Signed   By: Inge Rise M.D.   On: 08/07/2014 16:04   Dg Abd 2 Views  08/08/2014   CLINICAL DATA:  Small bowel obstruction, abdominal distention, followup  EXAM: ABDOMEN - 2 VIEW  COMPARISON:  CT abdomen pelvis of 08/07/2014  FINDINGS: Supine and erect views of the abdomen show persistently dilated loops of small bowel measuring up to 4.1 cm diameter consistent with persistent partial small bowel obstruction. The colon is not distended. Contrast is scattered throughout the nondistended colon. Anterior fusion at L5-S1 is noted.  IMPRESSION: Persistent small bowel obstruction.   Electronically Signed   By: Ivar Drape M.D.   On: 08/08/2014 08:22    Scheduled Meds: . aspirin  81 mg Oral Daily  . Fluticasone Furoate-Vilanterol  1 puff Inhalation Daily  . folic acid  1 mg Oral Daily  . gabapentin  300 mg Oral QHS  . hydrochlorothiazide  12.5 mg Oral Daily  . levothyroxine  100 mcg Oral QAC breakfast  . multivitamin with minerals  1 tablet Oral Daily  . sertraline  50 mg Oral q morning - 10a  . thiamine  100 mg Oral Daily   Continuous Infusions: . dextrose 5 % and 0.9% NaCl 50 mL/hr at 08/07/14 2022   Antibiotics Given (last 72 hours)   None      Principal Problem:   Small bowel obstruction Active Problems:   Hypertension   COPD (chronic obstructive pulmonary disease)   Hypothyroid   SBO (small bowel obstruction)    Time spent: 35 min    Shannon [name]  [facility] Pager [phone]. If 7PM-7AM, please contact night-coverage at  [email or web address], password [facility] 08/08/2014, 11:46 AM  LOS: 1 day

## 2014-08-08 NOTE — Progress Notes (Signed)
INITIAL NUTRITION ASSESSMENT  DOCUMENTATION CODES Per approved criteria  -Obesity Unspecified   INTERVENTION: -RD to follow for diet advancement  NUTRITION DIAGNOSIS: Inadequate oral intake related to altered GI function as evidenced by NPO.   Goal: Pt will meet >90% of estimated nutritional needs  Monitor:  Diet advancement, PO intake, labs, weight changes, I/O's   Reason for Assessment: MST=2  68 y.o. female  Admitting Dx: Small bowel obstruction  Shannon [name] is a 68 y.o. female presents with abdominal pain. Patient states that on Sunday she noted some pain in her belly. She though initially that she had a viral flu and did not think too Much about it. On Monday she states that she had some diarrhea and also had some vomiting. Today her pain became more severe. She states that she has had multiple surgeries in the past related to her abdominal but has never had a bowel obstruction.  ASSESSMENT: Pt admitted with SBO. MD considering NGT, but has not been placed yet.  Pt confirms poor appetite and weight loss. She reports that this started in August after having back surgery. She reveals UBW of 181#, which she last weighed prior to her back surgery. However, this is not consistent with documented wt hx, which reveals only a 3.9% wt loss over the past year.  She complains of early satiety since August, being unable to complete meals. PTA, she was unable to keep any PO's down. She reveals that she felt nauseated after drinking water in the ER and feeling like it "stayed in her throat".  Nutrition focused physical exam revealed no signs of fat or muscle depletion. Labs reviewed. CBGS: 104. K: 3.3.  Height: Ht Readings from Last 1 Encounters:  08/07/14 5\' 3"  (1.6 m)    Weight: Wt Readings from Last 1 Encounters:  08/07/14 169 lb 5 oz (76.8 kg)    Ideal Body Weight: 115#  % Ideal Body Weight: 147%  Wt Readings from Last 10 Encounters:  08/07/14 169 lb 5 oz (76.8 kg)   08/07/14 170 lb 4 oz (77.225 kg)  07/05/14 172 lb (78.019 kg)  06/05/14 172 lb (78.019 kg)  11/23/13 178 lb (80.74 kg)  11/01/13 180 lb (81.647 kg)  07/20/13 176 lb 3.2 oz (79.924 kg)  05/02/13 175 lb (79.379 kg)  12/19/12 183 lb (83.008 kg)  12/06/12 183 lb 2 oz (83.065 kg)    Usual Body Weight: 181#  % Usual Body Weight: 93%  BMI:  Body mass index is 30 kg/(m^2). Obesity, class I.   Estimated Nutritional Needs: Kcal: 1900-2100 Protein: 82-92 grams Fluid: 1.9-2.1 L  Skin: Intact  Diet Order: NPO  EDUCATION NEEDS: -No education needs identified at this time   Intake/Output Summary (Last 24 hours) at 08/08/14 1014 Last data filed at 08/08/14 0600  Gross per 24 hour  Intake    400 ml  Output    300 ml  Net    100 ml    Last BM: 08/07/14  Labs:   Recent Labs Lab 08/07/14 1143 08/08/14 0530  NA 138 138  K 3.9 3.3*  CL 102 101  CO2 31 24  BUN 16 12  CREATININE 0.9 0.60  CALCIUM 9.6 9.2  GLUCOSE 125* 99    CBG (last 3)   Recent Labs  08/08/14 0752  GLUCAP 104*    Scheduled Meds: . aspirin  81 mg Oral Daily  . Fluticasone Furoate-Vilanterol  1 puff Inhalation Daily  . folic acid  1 mg Oral Daily  .  gabapentin  300 mg Oral QHS  . hydrochlorothiazide  12.5 mg Oral Daily  . levothyroxine  100 mcg Oral QAC breakfast  . multivitamin with minerals  1 tablet Oral Daily  . sertraline  50 mg Oral q morning - 10a  . thiamine  100 mg Oral Daily    Continuous Infusions: . dextrose 5 % and 0.9% NaCl 50 mL/hr at 08/07/14 2022    Past Medical History  Diagnosis Date  . Thyroid disease     HYPO  . Restless leg syndrome   . Hx: UTI (urinary tract infection)   . Diverticulitis   . Anemia   . Reiter's disease     DIAGNOSED YEARS AGO IN [place] BY RHEUMATOLOGY  . HLD (hyperlipidemia)   . GERD (gastroesophageal reflux disease)   . Neuromuscular disorder   . Hearing loss   . Nasal congestion   . Abdominal pain   . Cancer 2009    stage IIA  right breast  . Hypertension   . Complication of anesthesia 1977    states stops breathing post op requring  "step down unit" care x 4 days  . PONV (postoperative nausea and vomiting) 2010  . 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 COPD (chronic obstructive pulmonary disease)   . Stroke 2010    TIA    Past Surgical History  Procedure Laterality Date  . Ovary surgery  2006  . Abdominal hysterectomy  1976  . Cholecystectomy  2008  . Hernia repair  2010  . Tram      right tram flap  . Breast surgery  2009    right mastectomy sent node biopsy  . Breast reconstruction      right tram, left implant and lift  . Eye surgery      RK Bilateral  . Laparoscopy  11/14/2012    Procedure: LAPAROSCOPY DIAGNOSTIC;  Surgeon: Rolm Bookbinder, MD;  Location: [facility];  Service: General;  Laterality: N/A;    Eloisa Chokshi A. Jimmye Norman, RD, LDN Pager: [phone] After hours Pager: [phone]

## 2014-08-08 NOTE — Progress Notes (Signed)
I have seen and examined the patient and agree with the assessment and plans. Given x-ray, will inset NG and repeat films tomorrow  Jef Futch A. Ninfa Linden  MD, FACS

## 2014-08-08 NOTE — Care Management Note (Unsigned)
    Page 1 of 1   08/10/2014     2:51:39 PM CARE MANAGEMENT NOTE 08/10/2014  Patient:  NYESHA, CLIFF   Account Number:  [id number]  Date Initiated:  08/08/2014  Documentation initiated by:  Tomi Bamberger  Subjective/Objective Assessment:   dx sbo  admit- lives with spouse.     Action/Plan:   Anticipated DC Date:  08/11/2014   Anticipated DC Plan:  [place]  CM consult      Choice offered to / List presented to:             Status of service:  In process, will continue to follow Medicare Important Message given?  YES (If response is "NO", the following Medicare IM given date fields will be blank) Date Medicare IM given:  08/10/2014 Medicare IM given by:  Tomi Bamberger Date Additional Medicare IM given:   Additional Medicare IM given by:    Discharge Disposition:    Per UR Regulation:  Reviewed for med. necessity/level of care/duration of stay  If discussed at [place] of Stay Meetings, dates discussed:    Comments:  08/10/14 [place], BSN [phone] advance to clears.  NCM will continue to monitor for dc needs.  08/08/14 [place], BSN [phone] patient lives with spouse, patient with sbo, may need ng tube, NCM will continue to follow for dc needs

## 2014-08-08 NOTE — Progress Notes (Signed)
Attempted to insert NG tube with Ginger Gleason. Patient did not tolerate well and pulled NG tube out stating "I dont want it" Notified Ferdinand Cava and MD Ninfa Linden that patient refused to get NG placed at this time by nursing at this time. Patient agreed to get NG tube placed in IR. MD Blackmon aware

## 2014-08-08 NOTE — Progress Notes (Signed)
Patient ID: Shannon [name], female   DOB: [birth year], 68 y.o.   MRN: [id number]    Subjective: Pt still feels about the same as last night.  No more nausea or vomiting, still with abdominal pain though.  Has passed some flatus  Objective: Vital signs in last 24 hours: Temp:  [97.6 F (36.4 C)-98.4 F (36.9 C)] 98.2 F (36.8 C) (10/21 8099) Pulse Rate:  [61-90] 61 (10/21 0613) Resp:  [14-18] 16 (10/21 0613) BP: (120-162)/(61-98) 134/79 mmHg (10/21 0613) SpO2:  [95 %-99 %] 95 % (10/21 8338) Weight:  [169 lb 5 oz (76.8 kg)-170 lb 4 oz (77.225 kg)] 169 lb 5 oz (76.8 kg) (10/20 2012) Last BM Date: 08/07/14  Intake/Output from previous day: 10/20 0701 - 10/21 0700 In: 400 [I.V.:400] Out: 300 [Urine:300] Intake/Output this shift:    PE: Abd: soft, still quite tender, distended, absent bowel sounds Heart: regular Lungs: CTAB  Lab Results:   Recent Labs  08/07/14 1143 08/08/14 0530  WBC 11.1* 8.4  HGB 13.8 12.4  HCT 41.9 37.5  PLT 307.0 251   BMET  Recent Labs  08/07/14 1143 08/08/14 0530  NA 138 138  K 3.9 3.3*  CL 102 101  CO2 31 24  GLUCOSE 125* 99  BUN 16 12  CREATININE 0.9 0.60  CALCIUM 9.6 9.2   PT/INR No results found for this basename: LABPROT, INR,  in the last 72 hours CMP     Component Value Date/Time   NA 138 08/08/2014 0530   NA 142 07/20/2013 1006   NA 140 05/13/2010 1153   K 3.3* 08/08/2014 0530   K 3.8 07/20/2013 1006   K 4.1 05/13/2010 1153   CL 101 08/08/2014 0530   CL 103 08/10/2012 0841   CL 99 05/13/2010 1153   CO2 24 08/08/2014 0530   CO2 27 07/20/2013 1006   CO2 29 05/13/2010 1153   GLUCOSE 99 08/08/2014 0530   GLUCOSE 101 07/20/2013 1006   GLUCOSE 182* 08/10/2012 0841   GLUCOSE 104 05/13/2010 1153   BUN 12 08/08/2014 0530   BUN 10.9 07/20/2013 1006   BUN 16 05/13/2010 1153   CREATININE 0.60 08/08/2014 0530   CREATININE 0.7 07/20/2013 1006   CREATININE 0.78 05/13/2012 1106   CALCIUM 9.2 08/08/2014 0530   CALCIUM 9.9 07/20/2013 1006    CALCIUM 9.9 05/13/2010 1153   PROT 7.4 08/08/2014 0530   PROT 7.7 07/20/2013 1006   PROT 8.3* 01/28/2010 1150   ALBUMIN 3.1* 08/08/2014 0530   ALBUMIN 3.2* 07/20/2013 1006   AST 15 08/08/2014 0530   AST 20 07/20/2013 1006   AST 23 01/28/2010 1150   ALT 9 08/08/2014 0530   ALT 15 07/20/2013 1006   ALT 17 01/28/2010 1150   ALKPHOS 87 08/08/2014 0530   ALKPHOS 72 07/20/2013 1006   ALKPHOS 88* 01/28/2010 1150   BILITOT 1.2 08/08/2014 0530   BILITOT 0.63 07/20/2013 1006   BILITOT 0.80 01/28/2010 1150   GFRNONAA >90 08/08/2014 0530   GFRAA >90 08/08/2014 0530   Lipase  No results found for this basename: lipase       Studies/Results: Ct Abdomen Pelvis W Contrast  08/07/2014   CLINICAL DATA:  Right lower quadrant pain for a few days, worsening every day. History of breast cancer.  EXAM: CT ABDOMEN AND PELVIS WITH CONTRAST  TECHNIQUE: Multidetector CT imaging of the abdomen and pelvis was performed using the standard protocol following bolus administration of intravenous contrast.  CONTRAST:  100 mL OMNIPAQUE IOHEXOL  300 MG/ML  SOLN  COMPARISON:  CT abdomen and pelvis 05/23/2012.  FINDINGS: Breast implant on the left and reconstruction on the right are noted. Lung bases are clear. No pleural or pericardial effusion. Heart size is normal.  The patient is status post cholecystectomy. Mild prominence of the common bile duct is unchanged. The liver, spleen, adrenal glands and pancreas appear normal. Small bilateral renal cysts are unchanged. Postoperative change is again seen in the right lower quadrant with surgical clips in the anterior abdominal wall and resection of the right rectus muscle noted.  The appendix is well visualized and normal in appearance. Small bowel loops are dilated at up to 3.2 cm with air-fluid levels present. Distal small bowel loops are completely decompressed. Findings are consistent with small bowel obstruction. Transition point is central in the low pelvis and likely due to  adhesions. No pneumatosis, portal venous gas or free intraperitoneal air is identified. There is no lymphadenopathy.  The patient is status post anterior fusion at L5-S1. There is lucency about both the screws in L5 and S1 most notable in the screw on the left in L5 and screw on the right in S1. The anterior, superior endplate of S1 appears partially fragmented.  IMPRESSION: Study is positive for small bowel obstruction which is likely due to adhesions. No evidence of ischemia is identified.  Status post L5-S1 fusion. Lucency about fusion screws with endplate sclerosis and fragmentation of the superior endplate of S1 are noted. Findings are most compatible with failed fusion.  Negative for appendicitis.  Findings were discussed with Dr. Diona Browner at the time of interpretation.   Electronically Signed   By: Inge Rise M.D.   On: 08/07/2014 16:04   Dg Abd 2 Views  08/08/2014   CLINICAL DATA:  Small bowel obstruction, abdominal distention, followup  EXAM: ABDOMEN - 2 VIEW  COMPARISON:  CT abdomen pelvis of 08/07/2014  FINDINGS: Supine and erect views of the abdomen show persistently dilated loops of small bowel measuring up to 4.1 cm diameter consistent with persistent partial small bowel obstruction. The colon is not distended. Contrast is scattered throughout the nondistended colon. Anterior fusion at L5-S1 is noted.  IMPRESSION: Persistent small bowel obstruction.   Electronically Signed   By: Ivar Drape M.D.   On: 08/08/2014 08:22    Anti-infectives: Anti-infectives   None       Assessment/Plan  1. pSBO  Plan: 1. The patient has contrast in her colon from her CT scan, but has some slight increase in SB dilatation today on her abdominal films.  However, she is not having any nausea or vomiting.  I think an NGT may be beneficial, but will d/w Dr. Ninfa Linden to see if he wants one or not.  Cont NPO.  Repeat films in the am.   LOS: 1 day    Kiran Carline E 08/08/2014, 10:12 AM Pager:  [phone]

## 2014-08-09 ENCOUNTER — Inpatient Hospital Stay (HOSPITAL_COMMUNITY): Payer: Medicare Other

## 2014-08-09 DIAGNOSIS — E038 Other specified hypothyroidism: Secondary | ICD-10-CM

## 2014-08-09 LAB — CBC
HCT: 36.3 % (ref 36.0–46.0)
HEMOGLOBIN: 12 g/dL (ref 12.0–15.0)
MCH: 32.3 pg (ref 26.0–34.0)
MCHC: 33.1 g/dL (ref 30.0–36.0)
MCV: 97.8 fL (ref 78.0–100.0)
Platelets: 229 10*3/uL (ref 150–400)
RBC: 3.71 MIL/uL — AB (ref 3.87–5.11)
RDW: 13.4 % (ref 11.5–15.5)
WBC: 6.5 10*3/uL (ref 4.0–10.5)

## 2014-08-09 LAB — BASIC METABOLIC PANEL
Anion gap: 12 (ref 5–15)
BUN: 10 mg/dL (ref 6–23)
CO2: 24 meq/L (ref 19–32)
Calcium: 8.9 mg/dL (ref 8.4–10.5)
Chloride: 104 mEq/L (ref 96–112)
Creatinine, Ser: 0.58 mg/dL (ref 0.50–1.10)
GFR calc Af Amer: >90 mL/min (ref >90)
GFR calc non Af Amer: >90 mL/min (ref >90)
GLUCOSE: 92 mg/dL (ref 70–99)
Potassium: 3.3 mEq/L — ABNORMAL LOW (ref 3.7–5.3)
Sodium: 140 mEq/L (ref 137–147)

## 2014-08-09 LAB — GLUCOSE, CAPILLARY: Glucose-Capillary: 94 mg/dL (ref 70–99)

## 2014-08-09 MED ORDER — POTASSIUM CHLORIDE 10 MEQ/100ML IV SOLN
10.0000 meq | INTRAVENOUS | Status: DC
  Administered 2014-08-09: 10 meq via INTRAVENOUS
  Filled 2014-08-09 (×4): qty 100

## 2014-08-09 MED ORDER — LEVOTHYROXINE SODIUM 100 MCG PO TABS
100.0000 ug | ORAL_TABLET | Freq: Every day | ORAL | Status: DC
  Administered 2014-08-10 – 2014-08-11 (×2): 100 ug via ORAL
  Filled 2014-08-09 (×3): qty 1

## 2014-08-09 MED ORDER — LEVOTHYROXINE SODIUM 100 MCG IV SOLR
50.0000 ug | Freq: Every day | INTRAVENOUS | Status: DC
Start: 2014-08-09 — End: 2014-08-09
  Administered 2014-08-09: 50 ug via INTRAVENOUS
  Filled 2014-08-09: qty 5

## 2014-08-09 MED ORDER — POTASSIUM CHLORIDE CRYS ER 20 MEQ PO TBCR
40.0000 meq | EXTENDED_RELEASE_TABLET | Freq: Once | ORAL | Status: DC
Start: 2014-08-09 — End: 2014-08-09

## 2014-08-09 MED ORDER — POTASSIUM CHLORIDE CRYS ER 20 MEQ PO TBCR
40.0000 meq | EXTENDED_RELEASE_TABLET | Freq: Once | ORAL | Status: AC
  Administered 2014-08-09: 40 meq via ORAL
  Filled 2014-08-09: qty 2

## 2014-08-09 NOTE — Progress Notes (Signed)
Patient ID: Shannon [name], female   DOB: Oct 20, 1945, 68 y.o.   MRN: [id number]    Subjective: Pt feels better today pain wise.  Passing some flatus and a small amount of liquid brown "stool"  Unable to get NGT placed in radiology.  Objective: Vital signs in last 24 hours: Temp:  [97.8 F (36.6 C)-98.4 F (36.9 C)] 98.1 F (36.7 C) (10/22 0523) Pulse Rate:  [60-64] 61 (10/22 0523) Resp:  [18] 18 (10/22 0523) BP: (123-139)/(72-75) 123/75 mmHg (10/22 0523) SpO2:  [95 %-97 %] 97 % (10/22 0523) Last BM Date: 08/08/14  Intake/Output from previous day: 10/21 0701 - 10/22 0700 In: 640 [P.O.:40; I.V.:600] Out: -  Intake/Output this shift:    PE: Abd: soft, still bloated in lower abdomen, but upper abdomen much softer.  +BS, much less tender  Lab Results:   Recent Labs  08/08/14 0530 08/09/14 0620  WBC 8.4 6.5  HGB 12.4 12.0  HCT 37.5 36.3  PLT 251 229   BMET  Recent Labs  08/08/14 0530 08/09/14 0620  NA 138 140  K 3.3* 3.3*  CL 101 104  CO2 24 24  GLUCOSE 99 92  BUN 12 10  CREATININE 0.60 0.58  CALCIUM 9.2 8.9   PT/INR No results found for this basename: LABPROT, INR,  in the last 72 hours CMP     Component Value Date/Time   NA 140 08/09/2014 0620   NA 142 07/20/2013 1006   NA 140 05/13/2010 1153   K 3.3* 08/09/2014 0620   K 3.8 07/20/2013 1006   K 4.1 05/13/2010 1153   CL 104 08/09/2014 0620   CL 103 08/10/2012 0841   CL 99 05/13/2010 1153   CO2 24 08/09/2014 0620   CO2 27 07/20/2013 1006   CO2 29 05/13/2010 1153   GLUCOSE 92 08/09/2014 0620   GLUCOSE 101 07/20/2013 1006   GLUCOSE 182* 08/10/2012 0841   GLUCOSE 104 05/13/2010 1153   BUN 10 08/09/2014 0620   BUN 10.9 07/20/2013 1006   BUN 16 05/13/2010 1153   CREATININE 0.58 08/09/2014 0620   CREATININE 0.7 07/20/2013 1006   CREATININE 0.78 05/13/2012 1106   CALCIUM 8.9 08/09/2014 0620   CALCIUM 9.9 07/20/2013 1006   CALCIUM 9.9 05/13/2010 1153   PROT 7.4 08/08/2014 0530   PROT 7.7 07/20/2013 1006   PROT  8.3* 01/28/2010 1150   ALBUMIN 3.1* 08/08/2014 0530   ALBUMIN 3.2* 07/20/2013 1006   AST 15 08/08/2014 0530   AST 20 07/20/2013 1006   AST 23 01/28/2010 1150   ALT 9 08/08/2014 0530   ALT 15 07/20/2013 1006   ALT 17 01/28/2010 1150   ALKPHOS 87 08/08/2014 0530   ALKPHOS 72 07/20/2013 1006   ALKPHOS 88* 01/28/2010 1150   BILITOT 1.2 08/08/2014 0530   BILITOT 0.63 07/20/2013 1006   BILITOT 0.80 01/28/2010 1150   GFRNONAA >90 08/09/2014 0620   GFRAA >90 08/09/2014 0620   Lipase  No results found for this basename: lipase       Studies/Results: Ct Abdomen Pelvis W Contrast  08/07/2014   CLINICAL DATA:  Right lower quadrant pain for a few days, worsening every day. History of breast cancer.  EXAM: CT ABDOMEN AND PELVIS WITH CONTRAST  TECHNIQUE: Multidetector CT imaging of the abdomen and pelvis was performed using the standard protocol following bolus administration of intravenous contrast.  CONTRAST:  100 mL OMNIPAQUE IOHEXOL 300 MG/ML  SOLN  COMPARISON:  CT abdomen and pelvis 05/23/2012.  FINDINGS: Breast  implant on the left and reconstruction on the right are noted. Lung bases are clear. No pleural or pericardial effusion. Heart size is normal.  The patient is status post cholecystectomy. Mild prominence of the common bile duct is unchanged. The liver, spleen, adrenal glands and pancreas appear normal. Small bilateral renal cysts are unchanged. Postoperative change is again seen in the right lower quadrant with surgical clips in the anterior abdominal wall and resection of the right rectus muscle noted.  The appendix is well visualized and normal in appearance. Small bowel loops are dilated at up to 3.2 cm with air-fluid levels present. Distal small bowel loops are completely decompressed. Findings are consistent with small bowel obstruction. Transition point is central in the low pelvis and likely due to adhesions. No pneumatosis, portal venous gas or free intraperitoneal air is identified. There is  no lymphadenopathy.  The patient is status post anterior fusion at L5-S1. There is lucency about both the screws in L5 and S1 most notable in the screw on the left in L5 and screw on the right in S1. The anterior, superior endplate of S1 appears partially fragmented.  IMPRESSION: Study is positive for small bowel obstruction which is likely due to adhesions. No evidence of ischemia is identified.  Status post L5-S1 fusion. Lucency about fusion screws with endplate sclerosis and fragmentation of the superior endplate of S1 are noted. Findings are most compatible with failed fusion.  Negative for appendicitis.  Findings were discussed with Dr. Diona Browner at the time of interpretation.   Electronically Signed   By: Inge Rise M.D.   On: 08/07/2014 16:04   Dg Abd 2 Views  08/09/2014   CLINICAL DATA:  Abdominal distention, small bowel obstruction, followup  EXAM: ABDOMEN - 2 VIEW  COMPARISON:  08/08/2014  FINDINGS: Persistent small bowel distention with multiple air-filled loops of small bowel identified.  No definite bowel wall thickening or perforation.  Small amounts of gas and residual contrast are present in the colon.  Scattered surgical clips in abdomen pelvis.  Hardware lumbosacral junction.  No definite urinary tract calcification.  Bones demineralized.  IMPRESSION: Persistent small bowel obstruction.   Electronically Signed   By: Lavonia Dana M.D.   On: 08/09/2014 08:18   Dg Abd 2 Views  08/08/2014   CLINICAL DATA:  Small bowel obstruction, abdominal distention, followup  EXAM: ABDOMEN - 2 VIEW  COMPARISON:  CT abdomen pelvis of 08/07/2014  FINDINGS: Supine and erect views of the abdomen show persistently dilated loops of small bowel measuring up to 4.1 cm diameter consistent with persistent partial small bowel obstruction. The colon is not distended. Contrast is scattered throughout the nondistended colon. Anterior fusion at L5-S1 is noted.  IMPRESSION: Persistent small bowel obstruction.    Electronically Signed   By: Ivar Drape M.D.   On: 08/08/2014 08:22    Anti-infectives: Anti-infectives   None       Assessment/Plan  1. pSBO  Plan: 1. Unable to place NGT.  Will have to continue conservative management without a tube.  Cont NPO x sips with meds. 2. Repeat abdominal films in the morning.   LOS: 2 days    Fate Caster E 08/09/2014, 11:32 AM Pager: [phone]

## 2014-08-09 NOTE — Progress Notes (Signed)
Claiborne Billings, PA made aware of patient not tolerating NG tube placement in IR. Orders received to keep patient NPO and okay for sips with meds.

## 2014-08-09 NOTE — Progress Notes (Signed)
I have seen and examined the patient and agree with the assessment and plans. Unfortunate situation with NG.  If no improvement in next 24 hours will need exp lap  Shannon [name] A. Ninfa Linden  MD, FACS

## 2014-08-09 NOTE — Progress Notes (Signed)
Sarah, RN from IV team assessed patient and recommending cold compress on left arm IV infiltration. Judson Roch stated she will come back to reassess once swelling has decreased. Patient arm restricted on right arm. Dr. Eliseo Squires notified.

## 2014-08-09 NOTE — Progress Notes (Signed)
Dr. Eliseo Squires paged and made aware patient could not tolerate NG tube placement in IR.

## 2014-08-09 NOTE — Progress Notes (Signed)
Dr. Eliseo Squires paged to clarify scheduled PO meds and NPO order. MD responded stating she will switch synthroid to IV and order to hold scheduled 1000 PO meds. Will adjust plan following NG tube placement in IR.

## 2014-08-09 NOTE — Progress Notes (Signed)
Clarified with MD on call Baltazar Najjar re;NPO order,since pt.has po meds.scheduled.,but already gave pt.her neurontin & oxycodone for pain.& Baltazar Najjar said it's ok for  The meds given at 10 pm,but to keep her NPO  till am.

## 2014-08-09 NOTE — Progress Notes (Signed)
PROGRESS NOTE  AHNYA AKRE DZH:[id number] DOB: [birth year] DOA: 08/07/2014 PCP: Arnette Norris, MD  Assessment/Plan: Small Bowel Obstruction -she has significant history of prior surgeries and could have adhesions  -General Surgery folowing - NPO   Hypertension -will continue monitoring pressure  -home medications   COPD -appears to be mild at best by history  -will continue with inhalers   Hypothyroid -will continue with synthroid  -TSH ok  Hypokalemia -replete IV   Code Status: full Family Communication: patient Disposition Plan:    Consultants:  surgery  Procedures:      HPI/Subjective: Passing gas  Objective: Filed Vitals:   08/09/14 0523  BP: 123/75  Pulse: 61  Temp: 98.1 F (36.7 C)  Resp: 18    Intake/Output Summary (Last 24 hours) at 08/09/14 1058 Last data filed at 08/09/14 0600  Gross per 24 hour  Intake    620 ml  Output      0 ml  Net    620 ml   Filed Weights   08/07/14 1652 08/07/14 2012  Weight: 77.111 kg (170 lb) 76.8 kg (169 lb 5 oz)    Exam:   General:  A+Ox3, NAD  Cardiovascular: rrr  Respiratory: clear  Abdomen: +BS, distended  Musculoskeletal: no edema   Data Reviewed: Basic Metabolic Panel:  Recent Labs Lab 08/07/14 1143 08/08/14 0530 08/09/14 0620  NA 138 138 140  K 3.9 3.3* 3.3*  CL 102 101 104  CO2 31 24 24   GLUCOSE 125* 99 92  BUN 16 12 10   CREATININE 0.9 0.60 0.58  CALCIUM 9.6 9.2 8.9   Liver Function Tests:  Recent Labs Lab 08/07/14 1143 08/08/14 0530  AST 22 15  ALT 12 9  ALKPHOS 86 87  BILITOT 1.1 1.2  PROT 8.2 7.4  ALBUMIN 3.4* 3.1*   No results found for this basename: LIPASE, AMYLASE,  in the last 168 hours No results found for this basename: AMMONIA,  in the last 168 hours CBC:  Recent Labs Lab 08/07/14 1143 08/08/14 0530 08/09/14 0620  WBC 11.1* 8.4 6.5  NEUTROABS 7.4  --   --   HGB 13.8 12.4 12.0  HCT 41.9 37.5 36.3  MCV 97.2 98.2 97.8  PLT 307.0 251 229    Cardiac Enzymes: No results found for this basename: CKTOTAL, CKMB, CKMBINDEX, TROPONINI,  in the last 168 hours BNP (last 3 results) No results found for this basename: PROBNP,  in the last 8760 hours CBG:  Recent Labs Lab 08/08/14 0752 08/09/14 0800  GLUCAP 104* 94    No results found for this or any previous visit (from the past 240 hour(s)).   Studies: Ct Abdomen Pelvis W Contrast  08/07/2014   CLINICAL DATA:  Right lower quadrant pain for a few days, worsening every day. History of breast cancer.  EXAM: CT ABDOMEN AND PELVIS WITH CONTRAST  TECHNIQUE: Multidetector CT imaging of the abdomen and pelvis was performed using the standard protocol following bolus administration of intravenous contrast.  CONTRAST:  100 mL OMNIPAQUE IOHEXOL 300 MG/ML  SOLN  COMPARISON:  CT abdomen and pelvis 05/23/2012.  FINDINGS: Breast implant on the left and reconstruction on the right are noted. Lung bases are clear. No pleural or pericardial effusion. Heart size is normal.  The patient is status post cholecystectomy. Mild prominence of the common bile duct is unchanged. The liver, spleen, adrenal glands and pancreas appear normal. Small bilateral renal cysts are unchanged. Postoperative change is again seen in the right lower quadrant  with surgical clips in the anterior abdominal wall and resection of the right rectus muscle noted.  The appendix is well visualized and normal in appearance. Small bowel loops are dilated at up to 3.2 cm with air-fluid levels present. Distal small bowel loops are completely decompressed. Findings are consistent with small bowel obstruction. Transition point is central in the low pelvis and likely due to adhesions. No pneumatosis, portal venous gas or free intraperitoneal air is identified. There is no lymphadenopathy.  The patient is status post anterior fusion at L5-S1. There is lucency about both the screws in L5 and S1 most notable in the screw on the left in L5 and screw on  the right in S1. The anterior, superior endplate of S1 appears partially fragmented.  IMPRESSION: Study is positive for small bowel obstruction which is likely due to adhesions. No evidence of ischemia is identified.  Status post L5-S1 fusion. Lucency about fusion screws with endplate sclerosis and fragmentation of the superior endplate of S1 are noted. Findings are most compatible with failed fusion.  Negative for appendicitis.  Findings were discussed with Dr. Diona Browner at the time of interpretation.   Electronically Signed   By: Inge Rise M.D.   On: 08/07/2014 16:04   Dg Abd 2 Views  08/09/2014   CLINICAL DATA:  Abdominal distention, small bowel obstruction, followup  EXAM: ABDOMEN - 2 VIEW  COMPARISON:  08/08/2014  FINDINGS: Persistent small bowel distention with multiple air-filled loops of small bowel identified.  No definite bowel wall thickening or perforation.  Small amounts of gas and residual contrast are present in the colon.  Scattered surgical clips in abdomen pelvis.  Hardware lumbosacral junction.  No definite urinary tract calcification.  Bones demineralized.  IMPRESSION: Persistent small bowel obstruction.   Electronically Signed   By: Lavonia Dana M.D.   On: 08/09/2014 08:18   Dg Abd 2 Views  08/08/2014   CLINICAL DATA:  Small bowel obstruction, abdominal distention, followup  EXAM: ABDOMEN - 2 VIEW  COMPARISON:  CT abdomen pelvis of 08/07/2014  FINDINGS: Supine and erect views of the abdomen show persistently dilated loops of small bowel measuring up to 4.1 cm diameter consistent with persistent partial small bowel obstruction. The colon is not distended. Contrast is scattered throughout the nondistended colon. Anterior fusion at L5-S1 is noted.  IMPRESSION: Persistent small bowel obstruction.   Electronically Signed   By: Ivar Drape M.D.   On: 08/08/2014 08:22    Scheduled Meds: . aspirin  81 mg Oral Daily  . Fluticasone Furoate-Vilanterol  1 puff Inhalation Daily  . folic  acid  1 mg Oral Daily  . gabapentin  300 mg Oral QHS  . hydrochlorothiazide  12.5 mg Oral Daily  . levothyroxine  50 mcg Intravenous Daily  . multivitamin with minerals  1 tablet Oral Daily  . potassium chloride  10 mEq Intravenous Q1 Hr x 4  . sertraline  50 mg Oral q morning - 10a  . thiamine  100 mg Oral Daily   Continuous Infusions: . dextrose 5 % and 0.9% NaCl 50 mL/hr at 08/08/14 2016   Antibiotics Given (last 72 hours)   None      Principal Problem:   Small bowel obstruction Active Problems:   Hypertension   COPD (chronic obstructive pulmonary disease)   Hypothyroid   SBO (small bowel obstruction)    Time spent: 35 min    Adiya Selmer, [facility] Pager [phone]. If 7PM-7AM, please contact night-coverage at [email or web address], password  [email or web address] 08/09/2014, 10:58 AM  LOS: 2 days

## 2014-08-10 ENCOUNTER — Inpatient Hospital Stay (HOSPITAL_COMMUNITY): Payer: Medicare Other

## 2014-08-10 DIAGNOSIS — R1031 Right lower quadrant pain: Secondary | ICD-10-CM

## 2014-08-10 LAB — BASIC METABOLIC PANEL
ANION GAP: 13 (ref 5–15)
BUN: 9 mg/dL (ref 6–23)
CALCIUM: 8.9 mg/dL (ref 8.4–10.5)
CO2: 24 mEq/L (ref 19–32)
Chloride: 104 mEq/L (ref 96–112)
Creatinine, Ser: 0.6 mg/dL (ref 0.50–1.10)
GFR calc non Af Amer: >90 mL/min (ref >90)
Glucose, Bld: 89 mg/dL (ref 70–99)
POTASSIUM: 3.7 meq/L (ref 3.7–5.3)
SODIUM: 141 meq/L (ref 137–147)

## 2014-08-10 LAB — CBC
HCT: 34.5 % — ABNORMAL LOW (ref 36.0–46.0)
HEMOGLOBIN: 11.6 g/dL — AB (ref 12.0–15.0)
MCH: 32.1 pg (ref 26.0–34.0)
MCHC: 33.6 g/dL (ref 30.0–36.0)
MCV: 95.6 fL (ref 78.0–100.0)
Platelets: 216 10*3/uL (ref 150–400)
RBC: 3.61 MIL/uL — ABNORMAL LOW (ref 3.87–5.11)
RDW: 13.3 % (ref 11.5–15.5)
WBC: 6.8 10*3/uL (ref 4.0–10.5)

## 2014-08-10 LAB — GLUCOSE, CAPILLARY: GLUCOSE-CAPILLARY: 101 mg/dL — AB (ref 70–99)

## 2014-08-10 LAB — MAGNESIUM: MAGNESIUM: 2 mg/dL (ref 1.5–2.5)

## 2014-08-10 NOTE — Progress Notes (Signed)
PROGRESS NOTE  Shannon [name] VQM:[id number] DOB: [birth year] DOA: 08/07/2014 PCP: Arnette Norris, MD  Assessment/Plan: Small Bowel Obstruction -she has significant history of prior surgeries and could have adhesions  -General Surgery folowing - seems to be resolving- clears and advance if tolerated  Hypertension -will continue monitoring pressure  -home medications   COPD -appears to be mild at best by history  -will continue with inhalers   Hypothyroid -will continue with synthroid  -TSH ok  Hypokalemia -repleted   Code Status: full Family Communication: patient Disposition Plan:    Consultants:  surgery  Procedures:      HPI/Subjective: Passing gas Excited about trying clears   Objective: Filed Vitals:   08/10/14 0920  BP: 134/79  Pulse: 79  Temp:   Resp:     Intake/Output Summary (Last 24 hours) at 08/10/14 1036 Last data filed at 08/10/14 0818  Gross per 24 hour  Intake 1213.33 ml  Output      0 ml  Net 1213.33 ml   Filed Weights   08/07/14 1652 08/07/14 2012  Weight: 77.111 kg (170 lb) 76.8 kg (169 lb 5 oz)    Exam:   General:  A+Ox3, NAD  Cardiovascular: rrr  Respiratory: clear  Abdomen: +BS, less distended  Musculoskeletal: no edema   Data Reviewed: Basic Metabolic Panel:  Recent Labs Lab 08/07/14 1143 08/08/14 0530 08/09/14 0620 08/10/14 0501  NA 138 138 140 141  K 3.9 3.3* 3.3* 3.7  CL 102 101 104 104  CO2 31 24 24 24   GLUCOSE 125* 99 92 89  BUN 16 12 10 9   CREATININE 0.9 0.60 0.58 0.60  CALCIUM 9.6 9.2 8.9 8.9  MG  --   --   --  2.0   Liver Function Tests:  Recent Labs Lab 08/07/14 1143 08/08/14 0530  AST 22 15  ALT 12 9  ALKPHOS 86 87  BILITOT 1.1 1.2  PROT 8.2 7.4  ALBUMIN 3.4* 3.1*   No results found for this basename: LIPASE, AMYLASE,  in the last 168 hours No results found for this basename: AMMONIA,  in the last 168 hours CBC:  Recent Labs Lab 08/07/14 1143 08/08/14 0530  08/09/14 0620 08/10/14 0501  WBC 11.1* 8.4 6.5 6.8  NEUTROABS 7.4  --   --   --   HGB 13.8 12.4 12.0 11.6*  HCT 41.9 37.5 36.3 34.5*  MCV 97.2 98.2 97.8 95.6  PLT 307.0 251 229 216   Cardiac Enzymes: No results found for this basename: CKTOTAL, CKMB, CKMBINDEX, TROPONINI,  in the last 168 hours BNP (last 3 results) No results found for this basename: PROBNP,  in the last 8760 hours CBG:  Recent Labs Lab 08/08/14 0752 08/09/14 0800 08/10/14 0755  GLUCAP 104* 94 101*    No results found for this or any previous visit (from the past 240 hour(s)).   Studies: Dg Abd 2 Views  08/10/2014   CLINICAL DATA:  Partial small bowel obstruction.  EXAM: ABDOMEN - 2 VIEW  COMPARISON:  August 09, 2014.  FINDINGS: The bowel gas pattern is normal. Dilated small bowel loops noted on prior exam have resolved. There is no evidence of free air. No abnormal calcifications are noted. Surgical clips are seen in the right side of the abdomen. Status post fusion of lower lumbar spine.  IMPRESSION: Dilated small bowel loops noted on prior exam have resolved. There is no evidence of bowel obstruction or ileus currently.   Electronically Signed   By: Jeneen Rinks  Green M.D.   On: 08/10/2014 07:40   Dg Abd 2 Views  08/09/2014   CLINICAL DATA:  Abdominal distention, small bowel obstruction, followup  EXAM: ABDOMEN - 2 VIEW  COMPARISON:  08/08/2014  FINDINGS: Persistent small bowel distention with multiple air-filled loops of small bowel identified.  No definite bowel wall thickening or perforation.  Small amounts of gas and residual contrast are present in the colon.  Scattered surgical clips in abdomen pelvis.  Hardware lumbosacral junction.  No definite urinary tract calcification.  Bones demineralized.  IMPRESSION: Persistent small bowel obstruction.   Electronically Signed   By: Lavonia Dana M.D.   On: 08/09/2014 08:18   Dg Fluoro Rm 1-60 Min - No Report  08/09/2014   CLINICAL DATA:    FLUORO ROOM 1-60 MINUTES   Fluoroscopy was utilized by the requesting physician.  No radiographic  interpretation.     Scheduled Meds: . aspirin  81 mg Oral Daily  . Fluticasone Furoate-Vilanterol  1 puff Inhalation Daily  . folic acid  1 mg Oral Daily  . gabapentin  300 mg Oral QHS  . hydrochlorothiazide  12.5 mg Oral Daily  . levothyroxine  100 mcg Oral QAC breakfast  . multivitamin with minerals  1 tablet Oral Daily  . sertraline  50 mg Oral q morning - 10a  . thiamine  100 mg Oral Daily   Continuous Infusions:   Antibiotics Given (last 72 hours)   None      Principal Problem:   Small bowel obstruction Active Problems:   Hypertension   COPD (chronic obstructive pulmonary disease)   Hypothyroid   SBO (small bowel obstruction)    Time spent: 25 min    [name]  [facility] Pager [phone]. If 7PM-7AM, please contact night-coverage at [email or web address], password [facility] 08/10/2014, 10:36 AM  LOS: 3 days

## 2014-08-10 NOTE — Progress Notes (Signed)
Patient ID: Shannon [name], female   DOB: 13-May-1946, 68 y.o.   MRN: [id number]    Subjective: Pt feels much better today.  Passing flatus and some stool.  No abdominal pain  Objective: Vital signs in last 24 hours: Temp:  [98.1 F (36.7 C)-98.2 F (36.8 C)] 98.1 F (36.7 C) Sep 08, 2023 0538) Pulse Rate:  [55-87] 79 09/08/2023 0920) Resp:  [16] 16 09/08/23 0538) BP: (134-148)/(71-81) 134/79 mmHg Sep 08, 2023 0920) SpO2:  [96 %-97 %] 97 % 09-08-23 0538) Last BM Date: 08/09/14  Intake/Output from previous day: 10/22 0701 - 08-Sep-2023 0700 In: 1088.3 [I.V.:988.3; IV Piggyback:100] Out: -  Intake/Output this shift: Total I/O In: 125 [P.O.:10; I.V.:115] Out: -   PE: Abd: soft, NT, ND, +BS  Lab Results:   Recent Labs  08/09/14 0620 2014/09/07 0501  WBC 6.5 6.8  HGB 12.0 11.6*  HCT 36.3 34.5*  PLT 229 216   BMET  Recent Labs  08/09/14 0620 09-07-2014 0501  NA 140 141  K 3.3* 3.7  CL 104 104  CO2 24 24  GLUCOSE 92 89  BUN 10 9  CREATININE 0.58 0.60  CALCIUM 8.9 8.9   PT/INR No results found for this basename: LABPROT, INR,  in the last 72 hours CMP     Component Value Date/Time   NA 141 2014-09-07 0501   NA 142 07/20/2013 1006   NA 140 05/13/2010 1153   K 3.7 Sep 07, 2014 0501   K 3.8 07/20/2013 1006   K 4.1 05/13/2010 1153   CL 104 09/07/14 0501   CL 103 2012/09/07 0841   CL 99 05/13/2010 1153   CO2 24 09-07-14 0501   CO2 27 07/20/2013 1006   CO2 29 05/13/2010 1153   GLUCOSE 89 07-Sep-2014 0501   GLUCOSE 101 07/20/2013 1006   GLUCOSE 182* 07-Sep-2012 0841   GLUCOSE 104 05/13/2010 1153   BUN 9 Sep 07, 2014 0501   BUN 10.9 07/20/2013 1006   BUN 16 05/13/2010 1153   CREATININE 0.60 07-Sep-2014 0501   CREATININE 0.7 07/20/2013 1006   CREATININE 0.78 05/13/2012 1106   CALCIUM 8.9 September 07, 2014 0501   CALCIUM 9.9 07/20/2013 1006   CALCIUM 9.9 05/13/2010 1153   PROT 7.4 08/08/2014 0530   PROT 7.7 07/20/2013 1006   PROT 8.3* 01/28/2010 1150   ALBUMIN 3.1* 08/08/2014 0530   ALBUMIN 3.2*  07/20/2013 1006   AST 15 08/08/2014 0530   AST 20 07/20/2013 1006   AST 23 01/28/2010 1150   ALT 9 08/08/2014 0530   ALT 15 07/20/2013 1006   ALT 17 01/28/2010 1150   ALKPHOS 87 08/08/2014 0530   ALKPHOS 72 07/20/2013 1006   ALKPHOS 88* 01/28/2010 1150   BILITOT 1.2 08/08/2014 0530   BILITOT 0.63 07/20/2013 1006   BILITOT 0.80 01/28/2010 1150   GFRNONAA >90 September 07, 2014 0501   GFRAA >90 07-Sep-2014 0501   Lipase  No results found for this basename: lipase       Studies/Results: Dg Abd 2 Views  07-Sep-2014   CLINICAL DATA:  Partial small bowel obstruction.  EXAM: ABDOMEN - 2 VIEW  COMPARISON:  August 09, 2014.  FINDINGS: The bowel gas pattern is normal. Dilated small bowel loops noted on prior exam have resolved. There is no evidence of free air. No abnormal calcifications are noted. Surgical clips are seen in the right side of the abdomen. Status post fusion of lower lumbar spine.  IMPRESSION: Dilated small bowel loops noted on prior exam have resolved. There is no evidence of bowel obstruction  or ileus currently.   Electronically Signed   By: Sabino Dick M.D.   On: 08/10/2014 07:40   Dg Abd 2 Views  08/09/2014   CLINICAL DATA:  Abdominal distention, small bowel obstruction, followup  EXAM: ABDOMEN - 2 VIEW  COMPARISON:  08/08/2014  FINDINGS: Persistent small bowel distention with multiple air-filled loops of small bowel identified.  No definite bowel wall thickening or perforation.  Small amounts of gas and residual contrast are present in the colon.  Scattered surgical clips in abdomen pelvis.  Hardware lumbosacral junction.  No definite urinary tract calcification.  Bones demineralized.  IMPRESSION: Persistent small bowel obstruction.   Electronically Signed   By: Lavonia Dana M.D.   On: 08/09/2014 08:18   Dg Fluoro Rm 1-60 Min - No Report  08/09/2014   CLINICAL DATA:    FLUORO ROOM 1-60 MINUTES  Fluoroscopy was utilized by the requesting physician.  No radiographic  interpretation.      Anti-infectives: Anti-infectives   None       Assessment/Plan  1. PSBO, resolving  Plan: 1. Films are improved and so is the patient clinically.  She can have clears for lunch and if she tolerates them, then fulls for dinner.   LOS: 3 days    Juanell Saffo E 08/10/2014, 10:25 AM Pager: [phone]

## 2014-08-10 NOTE — Progress Notes (Signed)
I have seen and examined the patient and agree with the assessment and plans. Clinically improving from SBO  Catheleen Langhorne A. Ninfa Linden  MD, FACS

## 2014-08-11 DIAGNOSIS — I10 Essential (primary) hypertension: Secondary | ICD-10-CM

## 2014-08-11 LAB — GLUCOSE, CAPILLARY: Glucose-Capillary: 89 mg/dL (ref 70–99)

## 2014-08-11 NOTE — Progress Notes (Signed)
  Subjective: Tol fulls, having flatus and bms   Objective: Vital signs in last 24 hours: Temp:  [97.9 F (36.6 C)-98.2 F (36.8 C)] 98 F (36.7 C) (10/24 0545) Pulse Rate:  [57-68] 57 (10/24 0545) Resp:  [18-20] 18 (10/24 0545) BP: (119-130)/(72-86) 119/72 mmHg (10/24 0545) SpO2:  [96 %-99 %] 96 % (10/24 0545) Last BM Date: 2014-08-17  Intake/Output from previous day: 08-18-2023 0701 - 10/24 0700 In: 1191 [P.O.:966; I.V.:225] Out: -  Intake/Output this shift:    GI: well healed incisions soft bs present nt  Lab Results:   Recent Labs  08/09/14 0620 Aug 17, 2014 0501  WBC 6.5 6.8  HGB 12.0 11.6*  HCT 36.3 34.5*  PLT 229 216   BMET  Recent Labs  08/09/14 0620 08/17/14 0501  NA 140 141  K 3.3* 3.7  CL 104 104  CO2 24 24  GLUCOSE 92 89  BUN 10 9  CREATININE 0.58 0.60  CALCIUM 8.9 8.9   PT/INR No results found for this basename: LABPROT, INR,  in the last 72 hours ABG No results found for this basename: PHART, PCO2, PO2, HCO3,  in the last 72 hours  Studies/Results: Dg Abd 2 Views  2014-08-17   CLINICAL DATA:  Partial small bowel obstruction.  EXAM: ABDOMEN - 2 VIEW  COMPARISON:  August 09, 2014.  FINDINGS: The bowel gas pattern is normal. Dilated small bowel loops noted on prior exam have resolved. There is no evidence of free air. No abnormal calcifications are noted. Surgical clips are seen in the right side of the abdomen. Status post fusion of lower lumbar spine.  IMPRESSION: Dilated small bowel loops noted on prior exam have resolved. There is no evidence of bowel obstruction or ileus currently.   Electronically Signed   By: Sabino Dick M.D.   On: August 17, 2014 07:40    Anti-infectives: Anti-infectives   None      Assessment/Plan: Resolved sbo  Dc home today  [facility] 08/11/2014

## 2014-08-11 NOTE — Progress Notes (Signed)
Patient discharge teaching given, including activity, diet, follow-up appoints, and medications. Patient verbalized understanding of all discharge instructions. IV access was d/c'd. Vitals are stable. Skin is intact except as charted in most recent assessments. Pt to be escorted out by NT, to be driven home by family. 

## 2014-08-11 NOTE — Discharge Summary (Signed)
Physician Discharge Summary  HAWLEY PAVIA BJS:[id number] DOB: 03-13-1946 DOA: 08/07/2014  PCP: Arnette Norris, MD  Admit date: 08/07/2014 Discharge date: 08/11/2014  Time spent: 35 minutes  Recommendations for Outpatient Follow-up:  1. Return to ER with any abdominal pain  Discharge Diagnoses:  Principal Problem:   Small bowel obstruction Active Problems:   Hypertension   COPD (chronic obstructive pulmonary disease)   Hypothyroid   SBO (small bowel obstruction)   Discharge Condition: improved  Diet recommendation: cardiac  Filed Weights   08/07/14 1652 08/07/14 2012  Weight: 77.111 kg (170 lb) 76.8 kg (169 lb 5 oz)    History of present illness:  Shannon [name] is a 68 y.o. female presents with abdominal pain. Patient states that on Sunday she noted some pain in her belly. She though initially that she had a viral flu and did not think too Much about it. On Monday she states that she had some diarrhea and also had some vomiting. Today her pain became more severe. She states that she has had multiple surgeries in the past related to her abdominal but has never had a bowel obstruction. She had a CT scan done and this shows SOB which is felt to be likely due to adhesions. She does not have any other complaints at this time   Hospital Course:  PSBO - resolved, seen by surgery  Hypothyroid- stable, continue home meds  Procedures:    Consultations:  surgery  Discharge Exam: Filed Vitals:   08/11/14 0545  BP: 119/72  Pulse: 57  Temp: 98 F (36.7 C)  Resp: 18    General: A+Ox3, NAD Cardiovascular: rrr Respiratory: clear  Discharge Instructions You were cared for by a hospitalist during your hospital stay. If you have any questions about your discharge medications or the care you received while you were in the hospital after you are discharged, you can call the unit and asked to speak with the hospitalist on call if the hospitalist that took care of you is not  available. Once you are discharged, your primary care physician will handle any further medical issues. Please note that NO REFILLS for any discharge medications will be authorized once you are discharged, as it is imperative that you return to your primary care physician (or establish a relationship with a primary care physician if you do not have one) for your aftercare needs so that they can reassess your need for medications and monitor your lab values.  Discharge Instructions   Diet - low sodium heart healthy    Complete by:  As directed      Discharge instructions    Complete by:  As directed   If taking pain medications, be sure to follow good bowel regimine- colase/miralax/sennacot     Increase activity slowly    Complete by:  As directed           Discharge Medication List as of 08/11/2014 11:46 AM    CONTINUE these medications which have NOT CHANGED   Details  aspirin 81 MG tablet Take 81 mg by mouth daily. , Until Discontinued, Historical Med    cyclobenzaprine (FLEXERIL) 10 MG tablet Take 10 mg by mouth daily as needed. Muscle spasms, Until Discontinued, Historical Med    Fluticasone Furoate-Vilanterol (BREO ELLIPTA) 100-25 MCG/INH AEPB Inhale 1 puff into the lungs daily., Starting 05/30/2014, Until Discontinued, Normal    gabapentin (NEURONTIN) 100 MG capsule Take 300 mg by mouth at bedtime. , Until Discontinued, Historical Med  hydrochlorothiazide (MICROZIDE) 12.5 MG capsule Take 12.5 mg by mouth daily., Until Discontinued, Historical Med    levothyroxine (SYNTHROID, LEVOTHROID) 100 MCG tablet Take 100 mcg by mouth every evening. , Until Discontinued, Historical Med    oxyCODONE (OXY IR/ROXICODONE) 5 MG immediate release tablet Take 10 mg by mouth at bedtime as needed for moderate pain. , Starting 04/26/2013, Until Discontinued, Historical Med    sertraline (ZOLOFT) 50 MG tablet Take 50 mg by mouth every morning., Until Discontinued, Historical Med      STOP taking  these medications     celecoxib (CELEBREX) 200 MG capsule        No Known Allergies    The results of significant diagnostics from this hospitalization (including imaging, microbiology, ancillary and laboratory) are listed below for reference.    Significant Diagnostic Studies: Ct Abdomen Pelvis W Contrast  08/07/2014   CLINICAL DATA:  Right lower quadrant pain for a few days, worsening every day. History of breast cancer.  EXAM: CT ABDOMEN AND PELVIS WITH CONTRAST  TECHNIQUE: Multidetector CT imaging of the abdomen and pelvis was performed using the standard protocol following bolus administration of intravenous contrast.  CONTRAST:  100 mL OMNIPAQUE IOHEXOL 300 MG/ML  SOLN  COMPARISON:  CT abdomen and pelvis 05/23/2012.  FINDINGS: Breast implant on the left and reconstruction on the right are noted. Lung bases are clear. No pleural or pericardial effusion. Heart size is normal.  The patient is status post cholecystectomy. Mild prominence of the common bile duct is unchanged. The liver, spleen, adrenal glands and pancreas appear normal. Small bilateral renal cysts are unchanged. Postoperative change is again seen in the right lower quadrant with surgical clips in the anterior abdominal wall and resection of the right rectus muscle noted.  The appendix is well visualized and normal in appearance. Small bowel loops are dilated at up to 3.2 cm with air-fluid levels present. Distal small bowel loops are completely decompressed. Findings are consistent with small bowel obstruction. Transition point is central in the low pelvis and likely due to adhesions. No pneumatosis, portal venous gas or free intraperitoneal air is identified. There is no lymphadenopathy.  The patient is status post anterior fusion at L5-S1. There is lucency about both the screws in L5 and S1 most notable in the screw on the left in L5 and screw on the right in S1. The anterior, superior endplate of S1 appears partially fragmented.   IMPRESSION: Study is positive for small bowel obstruction which is likely due to adhesions. No evidence of ischemia is identified.  Status post L5-S1 fusion. Lucency about fusion screws with endplate sclerosis and fragmentation of the superior endplate of S1 are noted. Findings are most compatible with failed fusion.  Negative for appendicitis.  Findings were discussed with Dr. Diona Browner at the time of interpretation.   Electronically Signed   By: Inge Rise M.D.   On: 08/07/2014 16:04   Dg Abd 2 Views  08/10/2014   CLINICAL DATA:  Partial small bowel obstruction.  EXAM: ABDOMEN - 2 VIEW  COMPARISON:  August 09, 2014.  FINDINGS: The bowel gas pattern is normal. Dilated small bowel loops noted on prior exam have resolved. There is no evidence of free air. No abnormal calcifications are noted. Surgical clips are seen in the right side of the abdomen. Status post fusion of lower lumbar spine.  IMPRESSION: Dilated small bowel loops noted on prior exam have resolved. There is no evidence of bowel obstruction or ileus currently.   Electronically  Signed   By: Sabino Dick M.D.   On: 08/10/2014 07:40   Dg Abd 2 Views  08/09/2014   CLINICAL DATA:  Abdominal distention, small bowel obstruction, followup  EXAM: ABDOMEN - 2 VIEW  COMPARISON:  08/08/2014  FINDINGS: Persistent small bowel distention with multiple air-filled loops of small bowel identified.  No definite bowel wall thickening or perforation.  Small amounts of gas and residual contrast are present in the colon.  Scattered surgical clips in abdomen pelvis.  Hardware lumbosacral junction.  No definite urinary tract calcification.  Bones demineralized.  IMPRESSION: Persistent small bowel obstruction.   Electronically Signed   By: Lavonia Dana M.D.   On: 08/09/2014 08:18   Dg Abd 2 Views  08/08/2014   CLINICAL DATA:  Small bowel obstruction, abdominal distention, followup  EXAM: ABDOMEN - 2 VIEW  COMPARISON:  CT abdomen pelvis of 08/07/2014  FINDINGS:  Supine and erect views of the abdomen show persistently dilated loops of small bowel measuring up to 4.1 cm diameter consistent with persistent partial small bowel obstruction. The colon is not distended. Contrast is scattered throughout the nondistended colon. Anterior fusion at L5-S1 is noted.  IMPRESSION: Persistent small bowel obstruction.   Electronically Signed   By: Ivar Drape M.D.   On: 08/08/2014 08:22   Dg Fluoro Rm 1-60 Min - No Report  08/09/2014   CLINICAL DATA:    FLUORO ROOM 1-60 MINUTES  Fluoroscopy was utilized by the requesting physician.  No radiographic  interpretation.     Microbiology: No results found for this or any previous visit (from the past 240 hour(s)).   Labs: Basic Metabolic Panel:  Recent Labs Lab 08/07/14 1143 08/08/14 0530 08/09/14 0620 08/10/14 0501  NA 138 138 140 141  K 3.9 3.3* 3.3* 3.7  CL 102 101 104 104  CO2 31 24 24 24   GLUCOSE 125* 99 92 89  BUN 16 12 10 9   CREATININE 0.9 0.60 0.58 0.60  CALCIUM 9.6 9.2 8.9 8.9  MG  --   --   --  2.0   Liver Function Tests:  Recent Labs Lab 08/07/14 1143 08/08/14 0530  AST 22 15  ALT 12 9  ALKPHOS 86 87  BILITOT 1.1 1.2  PROT 8.2 7.4  ALBUMIN 3.4* 3.1*   No results found for this basename: LIPASE, AMYLASE,  in the last 168 hours No results found for this basename: AMMONIA,  in the last 168 hours CBC:  Recent Labs Lab 08/07/14 1143 08/08/14 0530 08/09/14 0620 08/10/14 0501  WBC 11.1* 8.4 6.5 6.8  NEUTROABS 7.4  --   --   --   HGB 13.8 12.4 12.0 11.6*  HCT 41.9 37.5 36.3 34.5*  MCV 97.2 98.2 97.8 95.6  PLT 307.0 251 229 216   Cardiac Enzymes: No results found for this basename: CKTOTAL, CKMB, CKMBINDEX, TROPONINI,  in the last 168 hours BNP: BNP (last 3 results) No results found for this basename: PROBNP,  in the last 8760 hours CBG:  Recent Labs Lab 08/08/14 0752 08/09/14 0800 08/10/14 0755 08/11/14 0757  GLUCAP 104* 94 101* 89       Signed:  Reyansh Kushnir,  Kirby Cortese  [facility] 08/11/2014, 3:05 PM

## 2014-09-26 ENCOUNTER — Other Ambulatory Visit: Payer: Self-pay | Admitting: Family Medicine

## 2014-10-02 ENCOUNTER — Ambulatory Visit (INDEPENDENT_AMBULATORY_CARE_PROVIDER_SITE_OTHER): Payer: Medicare Other | Admitting: Family Medicine

## 2014-10-02 ENCOUNTER — Encounter: Payer: Self-pay | Admitting: Family Medicine

## 2014-10-02 ENCOUNTER — Other Ambulatory Visit: Payer: Self-pay | Admitting: *Deleted

## 2014-10-02 VITALS — BP 118/74 | HR 75 | Temp 98.0°F | Wt 171.0 lb

## 2014-10-02 DIAGNOSIS — M609 Myositis, unspecified: Secondary | ICD-10-CM

## 2014-10-02 DIAGNOSIS — M791 Myalgia: Secondary | ICD-10-CM

## 2014-10-02 DIAGNOSIS — R1310 Dysphagia, unspecified: Secondary | ICD-10-CM

## 2014-10-02 DIAGNOSIS — IMO0001 Reserved for inherently not codable concepts without codable children: Secondary | ICD-10-CM | POA: Insufficient documentation

## 2014-10-02 DIAGNOSIS — G8929 Other chronic pain: Secondary | ICD-10-CM | POA: Insufficient documentation

## 2014-10-02 DIAGNOSIS — M549 Dorsalgia, unspecified: Secondary | ICD-10-CM

## 2014-10-02 DIAGNOSIS — R490 Dysphonia: Secondary | ICD-10-CM

## 2014-10-02 LAB — COMPREHENSIVE METABOLIC PANEL
ALBUMIN: 3.8 g/dL (ref 3.5–5.2)
ALT: 12 U/L (ref 0–35)
AST: 18 U/L (ref 0–37)
Alkaline Phosphatase: 80 U/L (ref 39–117)
BILIRUBIN TOTAL: 0.3 mg/dL (ref 0.2–1.2)
BUN: 14 mg/dL (ref 6–23)
CO2: 27 mEq/L (ref 19–32)
Calcium: 9.5 mg/dL (ref 8.4–10.5)
Chloride: 104 mEq/L (ref 96–112)
Creatinine, Ser: 0.9 mg/dL (ref 0.4–1.2)
GFR: 69.86 mL/min (ref >60.00)
Glucose, Bld: 147 mg/dL — ABNORMAL HIGH (ref 70–99)
POTASSIUM: 3.8 meq/L (ref 3.5–5.1)
Sodium: 139 mEq/L (ref 135–145)
Total Protein: 7.9 g/dL (ref 6.0–8.3)

## 2014-10-02 LAB — SEDIMENTATION RATE: SED RATE: 61 mm/h — AB (ref 0–22)

## 2014-10-02 LAB — C-REACTIVE PROTEIN: CRP: 1.7 mg/dL (ref 0.5–20.0)

## 2014-10-02 LAB — CK: CK TOTAL: 76 U/L (ref 7–177)

## 2014-10-02 MED ORDER — HYDROCHLOROTHIAZIDE 12.5 MG PO CAPS: 12.5000 mg | ORAL_CAPSULE | Freq: Every day | ORAL | Status: DC

## 2014-10-02 MED ORDER — SERTRALINE HCL 50 MG PO TABS: 50.0000 mg | ORAL_TABLET | Freq: Every morning | ORAL | Status: DC

## 2014-10-02 NOTE — Telephone Encounter (Signed)
Patient returned your call and asked for you to call her back in the morning at [phone].

## 2014-10-02 NOTE — Progress Notes (Signed)
Pre visit review using our clinic review tool, if applicable. No additional management support is needed unless otherwise documented below in the visit note. 

## 2014-10-02 NOTE — Assessment & Plan Note (Signed)
New- ? PMR vs fibromyalgia. Check labs, will likely need rheumatology referral. The patient indicates understanding of these issues and agrees with the plan.  Orders Placed This Encounter  Procedures  . Comprehensive metabolic panel  . CK  . Sedimentation Rate  . C-reactive protein  . Cyclic Citrul Peptide Antibody, IGG  . Ambulatory referral to Gastroenterology

## 2014-10-02 NOTE — Telephone Encounter (Signed)
Yes ok to refill but I should clarify- She probably felt "rushed" because she had several acute and urgent issues to discuss which she brought up first in this "follow up visit"- difficulty swallowing, hoarseness, muscle and joint pains that she chose to discuss instead which is appropriate.  I did my best to address all of those in our acute appointment slot and most provider would agree, an additional office visit would be needed to follow up her chronic meds.

## 2014-10-02 NOTE — Telephone Encounter (Signed)
Pt came into office after being seen on today. She states that she is needing a refill of celebrex and is not wanting to RTO for an additional OV. Pt states that she feels as though she was "rushed" and did not have a chance to discuss her additional needs. Pt is questioning if she can have refill since it is on med Hx list and came in today for f/u appt

## 2014-10-02 NOTE — Patient Instructions (Signed)
Good to see you. I will call you with your lab results.  Please stop by to see [name] on your way out to set up your GI referral.

## 2014-10-02 NOTE — Assessment & Plan Note (Addendum)
New, progressive and with hoarseness involved.  Differential is wide- ? Longer term affects of CA treatment, also question if this includes esophageal stricture. Refer to GI for endoscopy. The patient indicates understanding of these issues and agrees with the plan.    Complex situation and pt is frustrated that she is "rushed" because we spent quite of bit of time on these concerning symptoms and were not able to discuss chronic meds during this 15 min appt.  I advised another appt but she refused.

## 2014-10-02 NOTE — Progress Notes (Signed)
Subjective:    Patient ID: Shannon [name], female    DOB: 04/29/1946, 68 y.o.   MRN: [id number]  HPI  68 yo female previous pt of Dr. Lorelei Pont with complicated history breast CA, hypothyroidism, h/o TIA, anemia, here for for follow up but has multiple concerns.  I have not seen her in over a year and not been seen for routine care since she established with me in 2013.  1.  Hoarseness- for over a year, she feels very hoarse.  Also feels she is having a more difficult time swallowing.  Admitted for small bowel obstruction in 10/15 (notes reviewed) and per pt, they had a very difficult time advancing the NGT. Not choking on solids or liquids.  Has never had an endoscopy.  Does not feel her throat is sore.   Initially thought it was from her inhalers but per pt, Dr. Lake Bells reassured her that this was not caused by her inhalers.  2.  Generalized muscle/bone pain- for months, she feels that her bones/muscles are tender to touch, ache everywhere, including her shoulders bilaterally, feet, arms hands.  No new rx.  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    Lab Results  Component Value Date   WBC 6.8 08/10/2014   HGB 11.6* 08/10/2014   HCT 34.5* 08/10/2014   MCV 95.6 08/10/2014   PLT 216 08/10/2014     Patient Active Problem List   Diagnosis Date Noted  . Abdominal pain, right lower quadrant 08/07/2014  . Small bowel obstruction 08/07/2014  . Hypothyroid 08/07/2014  . SBO (small bowel obstruction) 08/07/2014  . Pleural effusion 06/05/2014  . Solitary pulmonary nodule 11/20/2013  . Degeneration of lumbar intervertebral disc 12/30/2012  . COPD (chronic obstructive pulmonary disease) 09/20/2012  . Fatigue 09/20/2012  . Elevated BP 06/30/2012  . Hypertension 06/30/2012  . DOE (dyspnea on exertion) 06/30/2012  . Depression 04/15/2012  . Peripheral neuropathy 08/25/2011  . Lumbar radiculopathy 08/25/2011  . 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 HYPERLIPIDEMIA 10/30/2009  . TRANSIENT ISCHEMIC ATTACK 09/11/2009  . CERVICAL RADICULOPATHY 09/11/2009  . HYPOTHYROIDISM 06/17/2009  . ANEMIA-NOS 06/17/2009  . PARESTHESIA 06/17/2009  . BREAST CANCER, HX OF 06/17/2009  . DIVERTICULITIS, HX OF 06/17/2009   Past Medical History  Diagnosis Date  . Thyroid disease     HYPO  . Restless leg syndrome   . Hx: UTI (urinary tract infection)   . Diverticulitis   . Anemia   . Reiter's disease     DIAGNOSED YEARS AGO IN [place] BY RHEUMATOLOGY  . HLD (hyperlipidemia)   . GERD (gastroesophageal reflux disease)   . Neuromuscular disorder   . Hearing loss   . Nasal congestion   . Abdominal pain   . 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 Hypertension   . Complication of anesthesia 1977    states stops breathing post op requring  "step down unit" care x 4 days  . PONV (postoperative nausea and vomiting) 2010  . 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 COPD (chronic obstructive pulmonary disease)   . Stroke 2010    TIA   Past Surgical History  Procedure Laterality Date  . Ovary surgery  2006  . Abdominal hysterectomy  1976  . Cholecystectomy  2008  . Hernia repair  2010  . Tram      right tram flap  . Breast surgery  2009    right  mastectomy sent node biopsy  . Breast reconstruction      right tram, left implant and lift  . Eye surgery      RK Bilateral  . Laparoscopy  11/14/2012    Procedure: LAPAROSCOPY DIAGNOSTIC;  Surgeon: Rolm Bookbinder, MD;  Location: [facility];  Service: General;  Laterality: N/A;   History  Substance Use Topics  . Smoking status: Former Smoker -- 1.50 packs/day for 20 years    Types: Cigarettes    Quit date: 11/08/2003  . Smokeless tobacco: Never Used  . Alcohol Use: No     Comment: rarely   Family History  Problem Relation Age of Onset  . Lung cancer Mother     was a smoker  . Lung cancer Maternal Grandmother     never a smoker  . Emphysema Father      was a former smoker   No Known Allergies Current Outpatient Prescriptions on File Prior to Visit  Medication Sig Dispense Refill  . aspirin 81 MG tablet Take 81 mg by mouth daily.     . cyclobenzaprine (FLEXERIL) 10 MG tablet Take 10 mg by mouth daily as needed. Muscle spasms    . Fluticasone Furoate-Vilanterol (BREO ELLIPTA) 100-25 MCG/INH AEPB Inhale 1 puff into the lungs daily. 1 each 5  . gabapentin (NEURONTIN) 100 MG capsule Take 300 mg by mouth 2 (two) times daily.     Marland Kitchen levothyroxine (SYNTHROID, LEVOTHROID) 100 MCG tablet Take 100 mcg by mouth every evening.     Marland Kitchen oxyCODONE (OXY IR/ROXICODONE) 5 MG immediate release tablet Take 10 mg by mouth at bedtime as needed for moderate pain.     Marland Kitchen sertraline (ZOLOFT) 50 MG tablet Take 50 mg by mouth every morning.     No current facility-administered medications on file prior to visit.   The PMH, PSH, Social History, Family History, Medications, and allergies have been reviewed in [facility], and have been updated if relevant.   Review of Systems  Constitutional: Negative for fever and appetite change.  HENT: Positive for trouble swallowing and voice change. Negative for sore throat.   Respiratory: Negative.   Cardiovascular: Negative.   Gastrointestinal: Negative.   Endocrine: Negative.   Genitourinary: Negative.   Musculoskeletal: Positive for myalgias and arthralgias. Negative for gait problem.  Skin: Negative.   Allergic/Immunologic: Negative.   Hematological: Negative.   Psychiatric/Behavioral: Negative.   All other systems reviewed and are negative.     See HPI  Objective:   Physical Exam  Constitutional: She is oriented to person, place, and time. She appears well-developed and well-nourished. No distress.  HENT:  Head: Normocephalic and atraumatic.  Mouth/Throat: Oropharynx is clear and moist.  Eyes: Conjunctivae are normal.  Neck: Normal range of motion.  Cardiovascular: Normal rate.   Pulmonary/Chest: Effort normal.   Musculoskeletal: Normal range of motion. She exhibits tenderness. She exhibits no edema.  Neurological: She is alert and oriented to person, place, and time.  Skin: Skin is warm and dry.  Psychiatric: She has a normal mood and affect. Her behavior is normal. Judgment and thought content normal.  Nursing note and vitals reviewed.  BP 118/74 mmHg  Pulse 75  Temp(Src) 98 F (36.7 C) (Oral)  Wt 171 lb (77.565 kg)  SpO2 90% BP Readings from Last 3 Encounters:  10/02/14 118/74  08/11/14 119/72  08/07/14 120/90   Wt Readings from Last 3 Encounters:  10/02/14 171 lb (77.565 kg)  08/07/14 169 lb 5 oz (76.8 kg)  08/07/14 170 lb  4 oz (77.225 kg)           Assessment & Plan:

## 2014-10-02 NOTE — Telephone Encounter (Signed)
Lm on pts vm requesting a call back 

## 2014-10-03 ENCOUNTER — Telehealth: Payer: Self-pay | Admitting: Family Medicine

## 2014-10-03 DIAGNOSIS — R700 Elevated erythrocyte sedimentation rate: Secondary | ICD-10-CM

## 2014-10-03 DIAGNOSIS — IMO0001 Reserved for inherently not codable concepts without codable children: Secondary | ICD-10-CM

## 2014-10-03 LAB — CYCLIC CITRUL PEPTIDE ANTIBODY, IGG: Cyclic Citrullin Peptide Ab: <2 U/mL (ref 0.0–5.0)

## 2014-10-03 NOTE — Telephone Encounter (Signed)
Noted.  Thank you.  Referral placed.

## 2014-10-03 NOTE — Telephone Encounter (Signed)
Pt returned your call and would like to talk to you before she leaves home this morning.  Pt is confused on instructions you gave her yesterday. Please call. Thank you.

## 2014-10-03 NOTE — Telephone Encounter (Signed)
Spoke to pt and advised her that Dr Deborra Medina is wanting her to see rheumatology, not endo. Pt is agreeable to referral and was advised to await a call with appt details. Pt will also wait on celebrex Rx as the rheumatologist may prescribe an alternate med

## 2014-10-22 ENCOUNTER — Encounter: Payer: Self-pay | Admitting: Internal Medicine

## 2014-10-22 ENCOUNTER — Ambulatory Visit (INDEPENDENT_AMBULATORY_CARE_PROVIDER_SITE_OTHER): Payer: Medicare Other | Admitting: Internal Medicine

## 2014-10-22 ENCOUNTER — Ambulatory Visit: Payer: Medicare Other | Admitting: Internal Medicine

## 2014-10-22 VITALS — BP 114/76 | HR 67 | Temp 97.9°F | Wt 168.0 lb

## 2014-10-22 DIAGNOSIS — R42 Dizziness and giddiness: Secondary | ICD-10-CM

## 2014-10-22 MED ORDER — MECLIZINE HCL 50 MG PO TABS: 25.0000 mg | ORAL_TABLET | Freq: Three times a day (TID) | ORAL | Status: DC | PRN

## 2014-10-22 NOTE — Progress Notes (Signed)
Subjective:    Patient ID: Shannon [name], female    DOB: 07/22/1946, 69 y.o.   MRN: [id number]  HPI  Pt presents to the clinic today with c/o dizziness. She reports this started 6 days ago. She noticed this after getting off a plane. She does not feel like the room is spinning but more of a sense of imbalance. She has been knocking into walls because she can not walk straight. She has felt weak and not had an appetite. She has tried laying down, which makes it better but as soon as she gets up it is worse. She denies chest pain, shortness of breath or syncopal episodes. She has not tried anything OTC.  Review of Systems      Past Medical History  Diagnosis Date  . Thyroid disease     HYPO  . Restless leg syndrome   . Hx: UTI (urinary tract infection)   . Diverticulitis   . Anemia   . Reiter's disease     DIAGNOSED YEARS AGO IN [place] BY RHEUMATOLOGY  . HLD (hyperlipidemia)   . GERD (gastroesophageal reflux disease)   . Neuromuscular disorder   . Hearing loss   . Nasal congestion   . Abdominal pain   . 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 Hypertension   . Complication of anesthesia 1977    states stops breathing post op requring  "step down unit" care x 4 days  . PONV (postoperative nausea and vomiting) 2010  . 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 COPD (chronic obstructive pulmonary disease)   . Stroke 2010    TIA    Current Outpatient Prescriptions  Medication Sig Dispense Refill  . aspirin 81 MG tablet Take 81 mg by mouth daily.     . cyclobenzaprine (FLEXERIL) 10 MG tablet Take 10 mg by mouth daily as needed. Muscle spasms    . Fluticasone Furoate-Vilanterol (BREO ELLIPTA) 100-25 MCG/INH AEPB Inhale 1 puff into the lungs daily. 1 each 5  . gabapentin (NEURONTIN) 300 MG capsule   0  . hydrochlorothiazide (MICROZIDE) 12.5 MG capsule Take 1 capsule (12.5 mg total) by mouth daily. 30 capsule 4  . levothyroxine (SYNTHROID, LEVOTHROID) 100 MCG  tablet Take 100 mcg by mouth every evening.     Marland Kitchen oxyCODONE (OXY IR/ROXICODONE) 5 MG immediate release tablet Take 10 mg by mouth at bedtime as needed for moderate pain.     Marland Kitchen sertraline (ZOLOFT) 50 MG tablet Take 1 tablet (50 mg total) by mouth every morning. 30 tablet 2   No current facility-administered medications for this visit.    No Known Allergies  Family History  Problem Relation Age of Onset  . Lung cancer Mother     was a smoker  . Lung cancer Maternal Grandmother     never a smoker  . Emphysema Father     was a former smoker    History   Social History  . Marital Status: Married    Spouse Name: N/A    Number of Children: N/A  . Years of Education: N/A   Occupational History  . Retired    Social History Main Topics  . Smoking status: Former Smoker -- 1.50 packs/day for 20 years    Types: Cigarettes    Quit date: 11/08/2003  . Smokeless tobacco: Never Used  . Alcohol Use: No     Comment: rarely  . Drug Use: No  . Sexual Activity:  Yes   Other Topics Concern  . Not on file   Social History Narrative     Constitutional: Denies fever, malaise, fatigue, headache or abrupt weight changes.  HEENT: Denies eye pain, eye redness, ear pain, ringing in the ears, wax buildup, runny nose, nasal congestion, bloody nose, or sore throat. Respiratory: Denies difficulty breathing, shortness of breath, cough or sputum production.   Cardiovascular: Denies chest pain, chest tightness, palpitations or swelling in the hands or feet.  Gastrointestinal: Denies abdominal pain, bloating, constipation, diarrhea or blood in the stool.  GU: Denies urgency, frequency, pain with urination, burning sensation, blood in urine, odor or discharge. Musculoskeletal: Denies decrease in range of motion, difficulty with gait, muscle pain or joint pain and swelling.  Skin: Denies redness, rashes, lesions or ulcercations.  Neurological: Pt reports dizziness. Denies  difficulty with memory,  difficulty with speech or problems with coordination.   No other specific complaints in a complete review of systems (except as listed in HPI above).  Objective:   Physical Exam  BP 114/76 mmHg  Pulse 67  Temp(Src) 97.9 F (36.6 C) (Oral)  Wt 168 lb (76.204 kg)  SpO2 98% Wt Readings from Last 3 Encounters:  10/22/14 168 lb (76.204 kg)  10/02/14 171 lb (77.565 kg)  08/07/14 169 lb 5 oz (76.8 kg)    General: Appears her stated age, well developed, well nourished in NAD. Skin: Warm, dry and intact. No rashes, lesions or ulcerations noted. HEENT: Head: normal shape and size; Eyes: sclera white, no icterus, conjunctiva pink, PERRLA and EOMs intact, no nystagmus noted; Ears: Tm's gray and intact, normal light reflex;  Neck:  Neck supple, trachea midline. No masses, lumps or thyromegaly present.  Cardiovascular: Normal rate and rhythm. S1,S2 noted.  No murmur, rubs or gallops noted.  Pulmonary/Chest: Normal effort and positive vesicular breath sounds. No respiratory distress. No wheezes, rales or ronchi noted.   Neurological: Alert and oriented. Cranial nerves II-XII grossly intact. Coordination normal. Negative Rhomberg.   BMET    Component Value Date/Time   NA 139 10/02/2014 0844   NA 142 07/20/2013 1006   NA 140 05/13/2010 1153   K 3.8 10/02/2014 0844   K 3.8 07/20/2013 1006   K 4.1 05/13/2010 1153   CL 104 10/02/2014 0844   CL 103 08/10/2012 0841   CL 99 05/13/2010 1153   CO2 27 10/02/2014 0844   CO2 27 07/20/2013 1006   CO2 29 05/13/2010 1153   GLUCOSE 147* 10/02/2014 0844   GLUCOSE 101 07/20/2013 1006   GLUCOSE 182* 08/10/2012 0841   GLUCOSE 104 05/13/2010 1153   BUN 14 10/02/2014 0844   BUN 10.9 07/20/2013 1006   BUN 16 05/13/2010 1153   CREATININE 0.9 10/02/2014 0844   CREATININE 0.7 07/20/2013 1006   CREATININE 0.78 05/13/2012 1106   CALCIUM 9.5 10/02/2014 0844   CALCIUM 9.9 07/20/2013 1006   CALCIUM 9.9 05/13/2010 1153   GFRNONAA >90 08/10/2014 0501    GFRAA >90 08/10/2014 0501    Lipid Panel     Component Value Date/Time   CHOL 176 01/30/2010 0841   TRIG 130.0 01/30/2010 0841   HDL 79.10 01/30/2010 0841   CHOLHDL 2 01/30/2010 0841   VLDL 26.0 01/30/2010 0841   LDLCALC 71 01/30/2010 0841    CBC    Component Value Date/Time   WBC 6.8 08/10/2014 0501   WBC 7.8 07/20/2013 1006   WBC 7.5 05/13/2010 1153   RBC 3.61* 08/10/2014 0501   RBC 3.78 07/20/2013 1006  RBC 4.16 05/13/2010 1153   HGB 11.6* 08/10/2014 0501   HGB 13.0 07/20/2013 1006   HGB 13.8 05/13/2010 1153   HCT 34.5* 08/10/2014 0501   HCT 38.1 07/20/2013 1006   HCT 40.3 05/13/2010 1153   PLT 216 08/10/2014 0501   PLT 288 07/20/2013 1006   PLT 326 05/13/2010 1153   MCV 95.6 08/10/2014 0501   MCV 100.8 07/20/2013 1006   MCV 97 05/13/2010 1153   MCH 32.1 08/10/2014 0501   MCH 34.3* 07/20/2013 1006   MCH 33.1 05/13/2010 1153   MCHC 33.6 08/10/2014 0501   MCHC 34.1 07/20/2013 1006   MCHC 34.2 05/13/2010 1153   RDW 13.3 08/10/2014 0501   RDW 12.9 07/20/2013 1006   RDW 11.6 05/13/2010 1153   LYMPHSABS 2.7 08/07/2014 1143   LYMPHSABS 2.4 07/20/2013 1006   LYMPHSABS 2.6 05/13/2010 1153   MONOABS 0.9 08/07/2014 1143   MONOABS 0.7 07/20/2013 1006   EOSABS 0.1 08/07/2014 1143   EOSABS 0.1 07/20/2013 1006   EOSABS 0.2 05/13/2010 1153   BASOSABS 0.0 08/07/2014 1143   BASOSABS 0.1 07/20/2013 1006   BASOSABS 0.1 05/13/2010 1153    Hgb A1C Lab Results  Component Value Date   HGBA1C 5.6 08/07/2014         Assessment & Plan:   Dizziness:  Orthostatics- no drop ? If her thyroid is off- she reports she just had this checked last month and was normal, she declines repeat labs today Advised her to make sure she is eating enough and drinking plenty of fluids Will try Meclinzine 25 mg TID prn- if persists or worsen, follow up with PCP in 1 week

## 2014-10-23 NOTE — Patient Instructions (Signed)

## 2014-10-29 ENCOUNTER — Ambulatory Visit (INDEPENDENT_AMBULATORY_CARE_PROVIDER_SITE_OTHER): Payer: Medicare Other | Admitting: Family Medicine

## 2014-10-29 ENCOUNTER — Ambulatory Visit (INDEPENDENT_AMBULATORY_CARE_PROVIDER_SITE_OTHER)
Admission: RE | Admit: 2014-10-29 | Discharge: 2014-10-29 | Disposition: A | Discharge status: Home / Self Care | Payer: Medicare Other | Source: Ambulatory Visit | Attending: Family Medicine | Admitting: Family Medicine

## 2014-10-29 ENCOUNTER — Encounter: Payer: Self-pay | Admitting: Family Medicine

## 2014-10-29 VITALS — BP 140/82 | HR 66 | Temp 98.1°F | Wt 170.5 lb

## 2014-10-29 DIAGNOSIS — R42 Dizziness and giddiness: Secondary | ICD-10-CM | POA: Insufficient documentation

## 2014-10-29 DIAGNOSIS — Z8719 Personal history of other diseases of the digestive system: Secondary | ICD-10-CM | POA: Insufficient documentation

## 2014-10-29 NOTE — Assessment & Plan Note (Signed)
>  25 minutes spent in face to face time with patient, >50% spent in counselling or coordination of care Explained that our office could certainly call surgeon's office but I would highly suggest we work this up here as well- KUB ordered.  If abnormal or symptoms worsen, will proceed with CT abd. The patient indicates understanding of these issues and agrees with the plan.

## 2014-10-29 NOTE — Progress Notes (Signed)
Pre visit review using our clinic review tool, if applicable. No additional management support is needed unless otherwise documented below in the visit note. 

## 2014-10-29 NOTE — Patient Instructions (Signed)
Good to see you. Please stop by to talk with [name] on your way out.

## 2014-10-29 NOTE — Assessment & Plan Note (Signed)
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  No red flag symptoms. Call or return to clinic prn if these symptoms worsen or fail to improve as anticipated. The patient indicates understanding of these issues and agrees with the plan.

## 2014-10-29 NOTE — Progress Notes (Signed)
Subjective:   Patient ID: Shannon [name], female    DOB: 01/06/1946, 69 y.o.   MRN: [id number]  Shannon [name] is a pleasant 69 y.o. year old female who presents to clinic today with Follow-up  on 10/29/2014  HPI: Here for follow up vertigo with concerns about her bowels.  Saw [facility] on 1/4 (last week) for dizziness with changes in head position.  Treated with meclizine.  She is not taking the meclizine because it is too sedating.  Symptoms better but have not completely resolved. Denies nausea but she has had diarrhea for a week with intermittent abdominal pain.  She is concerned because this pain is similar to pain she had with SBO- admitted to [facility] for this on 10/15.  Did not require surgery- bowel rest only.   Not having any nausea, vomiting currently but did with that episode. She tried to make an appt with Dr. Donne Hazel to discuss this but per pt, he would not see her for another several months.   She would like for [place] to try to "speed that up."  Current Outpatient Prescriptions on File Prior to Visit  Medication Sig Dispense Refill  . aspirin 81 MG tablet Take 81 mg by mouth daily.     . cyclobenzaprine (FLEXERIL) 10 MG tablet Take 10 mg by mouth daily as needed. Muscle spasms    . Fluticasone Furoate-Vilanterol (BREO ELLIPTA) 100-25 MCG/INH AEPB Inhale 1 puff into the lungs daily. 1 each 5  . gabapentin (NEURONTIN) 300 MG capsule   0  . hydrochlorothiazide (MICROZIDE) 12.5 MG capsule Take 1 capsule (12.5 mg total) by mouth daily. 30 capsule 4  . levothyroxine (SYNTHROID, LEVOTHROID) 100 MCG tablet Take 100 mcg by mouth every evening.     . meclizine (ANTIVERT) 50 MG tablet Take 0.5 tablets (25 mg total) by mouth 3 (three) times daily as needed. 30 tablet 0  . oxyCODONE (OXY IR/ROXICODONE) 5 MG immediate release tablet Take 10 mg by mouth at bedtime as needed for moderate pain.     Marland Kitchen sertraline (ZOLOFT) 50 MG tablet Take 1 tablet (50 mg total) by mouth every morning.  30 tablet 2   No current facility-administered medications on file prior to visit.    No Known Allergies  Past Medical History  Diagnosis Date  . Thyroid disease     HYPO  . Restless leg syndrome   . Hx: UTI (urinary tract infection)   . Diverticulitis   . Anemia   . Reiter's disease     DIAGNOSED YEARS AGO IN [place] BY RHEUMATOLOGY  . HLD (hyperlipidemia)   . GERD (gastroesophageal reflux disease)   . Neuromuscular disorder   . Hearing loss   . Nasal congestion   . Abdominal pain   . 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 Hypertension   . Complication of anesthesia 1977    states stops breathing post op requring  "step down unit" care x 4 days  . PONV (postoperative nausea and vomiting) 2010  . 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 COPD (chronic obstructive pulmonary disease)   . Stroke 2010    TIA    Past Surgical History  Procedure Laterality Date  . Ovary surgery  2006  . Abdominal hysterectomy  1976  . Cholecystectomy  2008  . Hernia repair  2010  . Tram      right tram flap  . Breast surgery  2009  right mastectomy sent node biopsy  . Breast reconstruction      right tram, left implant and lift  . Eye surgery      RK Bilateral  . Laparoscopy  11/14/2012    Procedure: LAPAROSCOPY DIAGNOSTIC;  Surgeon: Rolm Bookbinder, MD;  Location: [facility];  Service: General;  Laterality: N/A;    Family History  Problem Relation Age of Onset  . Lung cancer Mother     was a smoker  . Lung cancer Maternal Grandmother     never a smoker  . Emphysema Father     was a former smoker    History   Social History  . Marital Status: Married    Spouse Name: N/A    Number of Children: N/A  . Years of Education: N/A   Occupational History  . Retired    Social History Main Topics  . Smoking status: Former Smoker -- 1.50 packs/day for 20 years    Types: Cigarettes    Quit date: 11/08/2003  . Smokeless tobacco: Never Used  . Alcohol Use: No      Comment: rarely  . Drug Use: No  . Sexual Activity: Yes   Other Topics Concern  . Not on file   Social History Narrative   The PMH, PSH, Social History, Family History, Medications, and allergies have been reviewed in [facility], and have been updated if relevant.   Review of Systems  Constitutional: Negative.   HENT: Negative for facial swelling, tinnitus, trouble swallowing and voice change.   Eyes: Negative.   Respiratory: Negative.   Gastrointestinal: Positive for abdominal pain, diarrhea and abdominal distention. Negative for nausea, vomiting, constipation, blood in stool, anal bleeding and rectal pain.  Endocrine: Negative.   Musculoskeletal: Negative.   Skin: Negative.   Neurological: Positive for dizziness. Negative for tremors, seizures, syncope, facial asymmetry, speech difficulty, weakness, light-headedness, numbness and headaches.  Hematological: Negative.   Psychiatric/Behavioral: Negative.   All other systems reviewed and are negative.      Objective:    BP 140/82 mmHg  Pulse 66  Temp(Src) 98.1 F (36.7 C) (Oral)  Wt 170 lb 8 oz (77.338 kg)  SpO2 94%   Physical Exam  Constitutional: She is oriented to person, place, and time. She appears well-developed and well-nourished. No distress.  HENT:  Head: Normocephalic and atraumatic.  Neg nystagmus with dix hallpike  Eyes: Conjunctivae are normal. No scleral icterus.  Neck: Normal range of motion.  Cardiovascular: Normal rate.   Pulmonary/Chest: Effort normal.  Abdominal: Soft. Bowel sounds are normal. She exhibits no distension and no mass. There is tenderness. There is no rebound and no guarding.  Musculoskeletal: Normal range of motion.  Neurological: She is alert and oriented to person, place, and time. No cranial nerve deficit.  Skin: Skin is warm and dry.  Psychiatric: She has a normal mood and affect. Her behavior is normal. Judgment and thought content normal.          Assessment & Plan:    History of small bowel obstruction - Plan: DG Abd 1 View  Dizziness and giddiness No Follow-up on file.

## 2014-11-15 ENCOUNTER — Ambulatory Visit: Payer: Self-pay | Admitting: Gastroenterology

## 2014-12-04 ENCOUNTER — Ambulatory Visit: Payer: Self-pay | Admitting: Gastroenterology

## 2014-12-17 ENCOUNTER — Encounter: Payer: Self-pay | Admitting: Family Medicine

## 2014-12-20 ENCOUNTER — Encounter: Payer: Self-pay | Admitting: Family Medicine

## 2014-12-25 ENCOUNTER — Encounter: Payer: Self-pay | Admitting: Family Medicine

## 2015-01-03 ENCOUNTER — Other Ambulatory Visit: Payer: Self-pay | Admitting: Family Medicine

## 2015-02-07 ENCOUNTER — Other Ambulatory Visit: Payer: Self-pay | Admitting: General Surgery

## 2015-02-07 ENCOUNTER — Ambulatory Visit (INDEPENDENT_AMBULATORY_CARE_PROVIDER_SITE_OTHER): Payer: Medicare Other | Admitting: Pulmonary Disease

## 2015-02-07 ENCOUNTER — Encounter: Payer: Self-pay | Admitting: Pulmonary Disease

## 2015-02-07 VITALS — BP 142/80 | HR 88 | Temp 97.8°F | Ht 63.0 in | Wt 178.6 lb

## 2015-02-07 DIAGNOSIS — J432 Centrilobular emphysema: Secondary | ICD-10-CM | POA: Diagnosis not present

## 2015-02-07 DIAGNOSIS — R911 Solitary pulmonary nodule: Secondary | ICD-10-CM | POA: Diagnosis not present

## 2015-02-07 DIAGNOSIS — N644 Mastodynia: Secondary | ICD-10-CM

## 2015-02-07 NOTE — Assessment & Plan Note (Signed)
She had stable 5 mm nodules on a 6 month f/u CT scan from 04/2014.  Her 06/2014 CXR did not show worrisome findings.  She needs a final repeat CT chest in January 2017.

## 2015-02-07 NOTE — Assessment & Plan Note (Signed)
Shannon [name] continues to struggle with dyspnea which is out of proportion to her mild airflow obstruction. I feel that this is primarily due to deconditioning because of her musculoskeletal issues which keep her from exercising on a regular basis. She has tried multiple inhaled therapies, which is effective. I reviewed the x-ray from September 2015 and there is no significant airspace disease noted. Her lung exam today is unchanged compared to prior so I do not feel that there is clear evidence of worsening lung disease.  Plan: -She was given a sample of a long-acting antimuscarinic long-acting beta agonist today (Stiolto) because she said that she did not feel that the last sample we gave her consistently went into her lungs. -If she does not have clinical improvement with the Stiolto then we should and sitter nebulized long-acting beta agonist and inhaled steroids. -However, I feel that the majority of her symptoms are due to deconditioning and so I strongly encouraged her today to start an exercise routine.

## 2015-02-07 NOTE — Progress Notes (Signed)
Friday the segments the Subjective:    Patient ID: Shannon [name], female    DOB: [birth year], 69 y.o.   MRN: [id number]  Synopsis: Shannon [name] was evaluated by [place] [facility] in November 2013 for dyspnea and fatigue.  She smoked 1.5 pack of cigarettes daily for at least 20 years and quit in 2005. She was diagnosed with breast cancer in 2010 and treated with a mastectomy, chemo/xrt.  She had simple spirometry consistent with obstruction and was started on Spiriva and did well.  HPI Chief Complaint  Patient presents with  . Follow-up    Pt is having increased sob episode, she has a night-time dry cough. Pt denies wheezing but does have chest tightness more so at bedtime.    Shannon [name] is really struggling with pain in her back which extends to her left foot.  She has burning pain in her left foot.  Initially she had numbness, but now she has pain in the left leg.    She has also been diagnosed with fibromyalgia and she has a lot of muscle aches.  She is not exercising due to all her pain.  She walks from time to time but she is really limited by pain.  She wants to start swimming.   She says she is having a hard time breathing. She doesn't think that the Memory Dance is helping much.  She didn't like the sample we gave her the last time (Anoro).  She coughs a night but produces ver little mucus.  She has some chest heaviness at night.  She feels an "elephant on her chest".    Past Medical History  Diagnosis Date  . Thyroid disease     HYPO  . Restless leg syndrome   . Hx: UTI (urinary tract infection)   . Diverticulitis   . Anemia   . Reiter's disease     DIAGNOSED YEARS AGO IN [place] BY RHEUMATOLOGY  . HLD (hyperlipidemia)   . GERD (gastroesophageal reflux disease)   . Neuromuscular disorder   . Hearing loss   . Nasal congestion   . Abdominal pain   . 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 Hypertension   . Complication of anesthesia 1977    states stops breathing post op requring  "step  down unit" care x 4 days  . PONV (postoperative nausea and vomiting) 2010  . 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 COPD (chronic obstructive pulmonary disease)   . Stroke 2010    TIA      Review of Systems  Constitutional: Positive for fatigue. Negative for fever and chills.  HENT: Negative for postnasal drip, rhinorrhea and sinus pressure.   Respiratory: Positive for shortness of breath. Negative for cough and wheezing.   Cardiovascular: Negative for chest pain, palpitations and leg swelling.       Objective:   Physical Exam  Filed Vitals:   02/07/15 1157  BP: 142/80  Pulse: 88  Temp: 97.8 F (36.6 C)  TempSrc: Oral  Height: 5\' 3"  (1.6 m)  Weight: 178 lb 9.6 oz (81.012 kg)  SpO2: 97%  RA  Gen: well appearing HENT: OP clear, TM's clear, neck supple PULM: CTA B, normal percussion CV: RRR, no mgr, trace edema GI: BS+, soft, nontender Derm: no cyanosis or rash Psyche: normal mood and affect   November 2013 simple spirometry showed obstruction on the flow volume loop FEV1 was greater than 80% predicted 06/2014 CXR personally removed > pleural  effusion clear, normal lung parenchyma    Assessment & Plan:   COPD (chronic obstructive pulmonary disease) [name] continues to struggle with dyspnea which is out of proportion to her mild airflow obstruction. I feel that this is primarily due to deconditioning because of her musculoskeletal issues which keep her from exercising on a regular basis. She has tried multiple inhaled therapies, which is effective. I reviewed the x-ray from September 2015 and there is no significant airspace disease noted. Her lung exam today is unchanged compared to prior so I do not feel that there is clear evidence of worsening lung disease.  Plan: -She was given a sample of a long-acting antimuscarinic long-acting beta agonist today (Stiolto) because she said that she did not feel that the last sample we gave her consistently went into  her lungs. -If she does not have clinical improvement with the Stiolto then we should and sitter nebulized long-acting beta agonist and inhaled steroids. -However, I feel that the majority of her symptoms are due to deconditioning and so I strongly encouraged her today to start an exercise routine.   Solitary pulmonary nodule She had stable 5 mm nodules on a 6 month f/u CT scan from 04/2014.  Her 06/2014 CXR did not show worrisome findings.  She needs a final repeat CT chest in January 2017.     Updated Medication List Outpatient Encounter Prescriptions as of 02/07/2015  Medication Sig  . aspirin 81 MG tablet Take 81 mg by mouth daily.   . cyclobenzaprine (FLEXERIL) 10 MG tablet Take 10 mg by mouth daily as needed. Muscle spasms  . etodolac (LODINE XL) 600 MG 24 hr tablet Take 600 mg by mouth daily.  . Fluticasone Furoate-Vilanterol (BREO ELLIPTA) 100-25 MCG/INH AEPB Inhale 1 puff into the lungs daily.  Marland Kitchen gabapentin (NEURONTIN) 300 MG capsule   . hydrochlorothiazide (MICROZIDE) 12.5 MG capsule Take 1 capsule (12.5 mg total) by mouth daily.  Marland Kitchen levothyroxine (SYNTHROID, LEVOTHROID) 100 MCG tablet Take 100 mcg by mouth every evening.   Marland Kitchen oxyCODONE (OXY IR/ROXICODONE) 5 MG immediate release tablet Take 10 mg by mouth at bedtime as needed for moderate pain.   . pantoprazole (PROTONIX) 40 MG tablet Take 40 mg by mouth daily.  . sertraline (ZOLOFT) 50 MG tablet TAKE ONE TABLET BY MOUTH EVERY MORNING  . [DISCONTINUED] meclizine (ANTIVERT) 50 MG tablet Take 0.5 tablets (25 mg total) by mouth 3 (three) times daily as needed. (Patient not taking: Reported on 02/07/2015)

## 2015-02-07 NOTE — Patient Instructions (Signed)
Take the Stiolto 2 puffs daily how you feel, let us know if you feel that this medication is better than the [facility] and we will prescribe it  Exercise regularly, I encourage you to swim on a regular basis follow-up 6 months or sooner if needed

## 2015-02-11 LAB — SURGICAL PATHOLOGY

## 2015-02-18 ENCOUNTER — Other Ambulatory Visit: Payer: Self-pay | Admitting: General Surgery

## 2015-02-18 ENCOUNTER — Ambulatory Visit
Admission: RE | Admit: 2015-02-18 | Discharge: 2015-02-18 | Disposition: A | Discharge status: Home / Self Care | Payer: Medicare Other | Source: Ambulatory Visit | Attending: General Surgery | Admitting: General Surgery

## 2015-02-18 DIAGNOSIS — N644 Mastodynia: Secondary | ICD-10-CM

## 2015-02-25 NOTE — H&P (Signed)
  Subjective:    Patient ID: Shannon [name] is a 69 y.o. female.  HPI  Patient of Dr. 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 She is status post adjuvant chemotherapy. She completed Arimidex . Last imaging of left breast 02/18/15 with concern of tenderness notes, diagnostic MMG and [place] benign. Previous patient of Dr. Humphrey Rolls  Underwent TRAM in 2010 and opposite masto/augmentation with Dr. Harlow Mares. Implant retropectoral, size unknown. No records available today of prior surgeries. Patient presents with concern that TRAM flap is much larger in size than left breast, especially laterally.States too heavy, too wise and develops rash beneath reconstruction with warm weather. Per patient, she has discussed this with her primary surgeon over several visits and counseled nothing could be done.  Prior to surgery B cup, current C cup.  Wt overall 20 lbs higher than at time of reconstruction and cancer diagnosis, but reports 15 lb wt loss in 2015 due to bowel obstruction, and asymmetry breast size persisted. She has since regained this weight.  Had hernia repair with mesh, states this was due to CCY, not related to TRAM.   With regards to her COPD on Stiloto. Has had no recent treatment for exacerbation, no recent steroids or PNA.   Review of Systems  Constitutional:   + hair loss  HENT: Positive for hearing loss.  Respiratory: Positive for cough and shortness of breath.  Endocrine: Positive for cold intolerance and heat intolerance.  Musculoskeletal: Positive for myalgias, back pain and arthralgias.  Neurological: Positive for weakness, light-headedness and numbness.  Hematological: Bruises/bleeds easily.  Psychiatric/Behavioral: Positive for dysphoric mood.   Remainder 12 point review negative    Objective:   Physical Exam  Cardiovascular: Normal rate.  Pulmonary/Chest: Effort normal.  Abdominal: Soft.  No hernias, healed transverse  scar, some fullness/dog ears laterally   Right chest well healed ptotic TRAM flap without masses, larger volume than left breast. Left breast no contracture, Wise pattern scar SN to nipple L 24 cm BW L 16 cm Nipple to IMF L 11 cm    Assessment:     S/p R TRAM and Left mastopexy, augmentation reconstruction     Plan:     Offered reduction of right TRAM flap. Likely will utilize superior skin paddle/inset scar to excise tissue and allow for some lift as well. May require liposuction lateral chest if cannot adequately directly excise tissue in this area for prior scars. May require drain. Given hx of COPD will plan overnight stay. Reviewed changes with aging, wt gain/loss, residual asymmetry.   Irene Limbo, MD [facility]astic [facility] Reconstruct[facility] Surgery [phone]

## 2015-02-27 ENCOUNTER — Encounter (HOSPITAL_BASED_OUTPATIENT_CLINIC_OR_DEPARTMENT_OTHER): Payer: Self-pay | Admitting: *Deleted

## 2015-02-27 ENCOUNTER — Other Ambulatory Visit: Payer: Self-pay

## 2015-02-27 ENCOUNTER — Encounter (HOSPITAL_BASED_OUTPATIENT_CLINIC_OR_DEPARTMENT_OTHER)
Admission: RE | Admit: 2015-02-27 | Discharge: 2015-02-27 | Disposition: A | Discharge status: Home / Self Care | Payer: Medicare Other | Source: Ambulatory Visit | Attending: Plastic Surgery | Admitting: Plastic Surgery

## 2015-02-27 DIAGNOSIS — K219 Gastro-esophageal reflux disease without esophagitis: Secondary | ICD-10-CM | POA: Diagnosis not present

## 2015-02-27 DIAGNOSIS — N651 Disproportion of reconstructed breast: Secondary | ICD-10-CM | POA: Diagnosis not present

## 2015-02-27 DIAGNOSIS — Z9011 Acquired absence of right breast and nipple: Secondary | ICD-10-CM | POA: Diagnosis not present

## 2015-02-27 DIAGNOSIS — Z87891 Personal history of nicotine dependence: Secondary | ICD-10-CM | POA: Diagnosis not present

## 2015-02-27 DIAGNOSIS — J449 Chronic obstructive pulmonary disease, unspecified: Secondary | ICD-10-CM | POA: Diagnosis not present

## 2015-02-27 DIAGNOSIS — Z9221 Personal history of antineoplastic chemotherapy: Secondary | ICD-10-CM | POA: Diagnosis not present

## 2015-02-27 DIAGNOSIS — Z853 Personal history of malignant neoplasm of breast: Secondary | ICD-10-CM | POA: Diagnosis not present

## 2015-02-27 DIAGNOSIS — I10 Essential (primary) hypertension: Secondary | ICD-10-CM | POA: Diagnosis not present

## 2015-02-27 LAB — CBC
HCT: 38 % (ref 36.0–46.0)
Hemoglobin: 12.7 g/dL (ref 12.0–15.0)
MCH: 32.3 pg (ref 26.0–34.0)
MCHC: 33.4 g/dL (ref 30.0–36.0)
MCV: 96.7 fL (ref 78.0–100.0)
PLATELETS: 219 10*3/uL (ref 150–400)
RBC: 3.93 MIL/uL (ref 3.87–5.11)
RDW: 13 % (ref 11.5–15.5)
WBC: 9 10*3/uL (ref 4.0–10.5)

## 2015-02-27 LAB — BASIC METABOLIC PANEL
ANION GAP: 8 (ref 5–15)
BUN: 11 mg/dL (ref 6–20)
CALCIUM: 9.7 mg/dL (ref 8.9–10.3)
CO2: 32 mmol/L (ref 22–32)
Chloride: 102 mmol/L (ref 101–111)
Creatinine, Ser: 0.69 mg/dL (ref 0.44–1.00)
GFR calc Af Amer: >60 mL/min (ref >60)
GFR calc non Af Amer: >60 mL/min (ref >60)
Glucose, Bld: 108 mg/dL — ABNORMAL HIGH (ref 70–99)
Potassium: 4 mmol/L (ref 3.5–5.1)
SODIUM: 142 mmol/L (ref 135–145)

## 2015-02-27 NOTE — Progress Notes (Signed)
Patient is coming in for CBC, BMET and EKG. Bring overnight bag for 23 hr obs stay.

## 2015-03-01 ENCOUNTER — Encounter (HOSPITAL_BASED_OUTPATIENT_CLINIC_OR_DEPARTMENT_OTHER)
Admission: RE | Disposition: A | Discharge status: Home / Self Care | Payer: Self-pay | Source: Ambulatory Visit | Attending: Plastic Surgery

## 2015-03-01 ENCOUNTER — Ambulatory Visit (HOSPITAL_BASED_OUTPATIENT_CLINIC_OR_DEPARTMENT_OTHER): Payer: Medicare Other | Admitting: Anesthesiology

## 2015-03-01 ENCOUNTER — Ambulatory Visit (HOSPITAL_BASED_OUTPATIENT_CLINIC_OR_DEPARTMENT_OTHER)
Admission: RE | Admit: 2015-03-01 | Discharge: 2015-03-01 | Disposition: A | Discharge status: Home / Self Care | Payer: Medicare Other | Source: Ambulatory Visit | Attending: Plastic Surgery | Admitting: Plastic Surgery

## 2015-03-01 ENCOUNTER — Encounter (HOSPITAL_BASED_OUTPATIENT_CLINIC_OR_DEPARTMENT_OTHER): Payer: Self-pay | Admitting: *Deleted

## 2015-03-01 DIAGNOSIS — Z853 Personal history of malignant neoplasm of breast: Secondary | ICD-10-CM | POA: Insufficient documentation

## 2015-03-01 DIAGNOSIS — K219 Gastro-esophageal reflux disease without esophagitis: Secondary | ICD-10-CM | POA: Diagnosis not present

## 2015-03-01 DIAGNOSIS — Z9011 Acquired absence of right breast and nipple: Secondary | ICD-10-CM | POA: Insufficient documentation

## 2015-03-01 DIAGNOSIS — I10 Essential (primary) hypertension: Secondary | ICD-10-CM | POA: Insufficient documentation

## 2015-03-01 DIAGNOSIS — J449 Chronic obstructive pulmonary disease, unspecified: Secondary | ICD-10-CM | POA: Diagnosis not present

## 2015-03-01 DIAGNOSIS — Z87891 Personal history of nicotine dependence: Secondary | ICD-10-CM | POA: Insufficient documentation

## 2015-03-01 DIAGNOSIS — Z9221 Personal history of antineoplastic chemotherapy: Secondary | ICD-10-CM | POA: Insufficient documentation

## 2015-03-01 DIAGNOSIS — N651 Disproportion of reconstructed breast: Secondary | ICD-10-CM | POA: Diagnosis not present

## 2015-03-01 HISTORY — DX: Major depressive disorder, single episode, unspecified: F32.9

## 2015-03-01 HISTORY — DX: Anxiety disorder, unspecified: F41.9

## 2015-03-01 HISTORY — PX: MASTOPEXY TRAM REDUCTION WITH LIPOSUCTION: SHX5963

## 2015-03-01 HISTORY — DX: Depression, unspecified: F32.A

## 2015-03-01 LAB — POCT HEMOGLOBIN-HEMACUE: Hemoglobin: 13.5 g/dL (ref 12.0–15.0)

## 2015-03-01 SURGERY — MASTOPEXY TRAM REDUCTION WITH LIPOSUCTION
Anesthesia: General | Site: Breast | Laterality: Right

## 2015-03-01 MED ORDER — CHLORHEXIDINE GLUCONATE 4 % EX LIQD: 1.0000 "application " | Freq: Once | CUTANEOUS | Status: DC

## 2015-03-01 MED ORDER — HYDROMORPHONE HCL 1 MG/ML IJ SOLN: 0.2500 mg | INTRAMUSCULAR | Status: DC | PRN

## 2015-03-01 MED ORDER — DEXAMETHASONE SODIUM PHOSPHATE 4 MG/ML IJ SOLN
INTRAMUSCULAR | Status: DC | PRN
  Administered 2015-03-01: 10 mg via INTRAVENOUS

## 2015-03-01 MED ORDER — SCOPOLAMINE 1 MG/3DAYS TD PT72: 1.0000 | MEDICATED_PATCH | TRANSDERMAL | Status: DC

## 2015-03-01 MED ORDER — SCOPOLAMINE 1 MG/3DAYS TD PT72
1.0000 | MEDICATED_PATCH | TRANSDERMAL | Status: DC
  Administered 2015-03-01: 1.5 mg via TRANSDERMAL

## 2015-03-01 MED ORDER — CEFAZOLIN SODIUM-DEXTROSE 2-3 GM-% IV SOLR
INTRAVENOUS | Status: AC
  Filled 2015-03-01: qty 50

## 2015-03-01 MED ORDER — SUFENTANIL CITRATE 50 MCG/ML IV SOLN
INTRAVENOUS | Status: AC
  Filled 2015-03-01: qty 1

## 2015-03-01 MED ORDER — EPINEPHRINE HCL 1 MG/ML IJ SOLN
INTRAMUSCULAR | Status: AC
  Filled 2015-03-01: qty 1

## 2015-03-01 MED ORDER — ONDANSETRON HCL 4 MG/2ML IJ SOLN
INTRAMUSCULAR | Status: DC | PRN
  Administered 2015-03-01: 4 mg via INTRAVENOUS

## 2015-03-01 MED ORDER — LACTATED RINGERS IV SOLN
INTRAVENOUS | Status: DC
  Administered 2015-03-01: 09:00:00 via INTRAVENOUS
  Administered 2015-03-01: 10 mL/h via INTRAVENOUS
  Administered 2015-03-01: 11:00:00 via INTRAVENOUS

## 2015-03-01 MED ORDER — SODIUM BICARBONATE 4 % IV SOLN
INTRAVENOUS | Status: AC
  Filled 2015-03-01: qty 10

## 2015-03-01 MED ORDER — SODIUM BICARBONATE 4 % IV SOLN
INTRAVENOUS | Status: DC | PRN
  Administered 2015-03-01: 10 mL via INTRAVENOUS

## 2015-03-01 MED ORDER — LIDOCAINE HCL 1 % IJ SOLN
INTRAMUSCULAR | Status: DC | PRN
  Administered 2015-03-01: 1000 mL

## 2015-03-01 MED ORDER — CEFAZOLIN SODIUM-DEXTROSE 2-3 GM-% IV SOLR: 2.0000 g | INTRAVENOUS | Status: DC

## 2015-03-01 MED ORDER — SUCCINYLCHOLINE CHLORIDE 20 MG/ML IJ SOLN
INTRAMUSCULAR | Status: AC
  Filled 2015-03-01: qty 1

## 2015-03-01 MED ORDER — SUFENTANIL CITRATE 50 MCG/ML IV SOLN
INTRAVENOUS | Status: DC | PRN
  Administered 2015-03-01: 20 ug via INTRAVENOUS

## 2015-03-01 MED ORDER — LIDOCAINE HCL (PF) 1 % IJ SOLN
INTRAMUSCULAR | Status: AC
  Filled 2015-03-01: qty 60

## 2015-03-01 MED ORDER — FENTANYL CITRATE (PF) 100 MCG/2ML IJ SOLN: 50.0000 ug | INTRAMUSCULAR | Status: DC | PRN

## 2015-03-01 MED ORDER — OXYCODONE HCL 5 MG/5ML PO SOLN: 5.0000 mg | Freq: Once | ORAL | Status: DC | PRN

## 2015-03-01 MED ORDER — MIDAZOLAM HCL 2 MG/2ML IJ SOLN
1.0000 mg | INTRAMUSCULAR | Status: DC | PRN
  Administered 2015-03-01: 2 mg via INTRAVENOUS

## 2015-03-01 MED ORDER — MEPERIDINE HCL 25 MG/ML IJ SOLN: 6.2500 mg | INTRAMUSCULAR | Status: DC | PRN

## 2015-03-01 MED ORDER — LIDOCAINE HCL (CARDIAC) 20 MG/ML IV SOLN
INTRAVENOUS | Status: DC | PRN
  Administered 2015-03-01: 50 mg via INTRAVENOUS

## 2015-03-01 MED ORDER — MIDAZOLAM HCL 2 MG/2ML IJ SOLN
INTRAMUSCULAR | Status: AC
  Filled 2015-03-01: qty 2

## 2015-03-01 MED ORDER — EPHEDRINE SULFATE 50 MG/ML IJ SOLN
INTRAMUSCULAR | Status: DC | PRN
  Administered 2015-03-01 (×2): 10 mg via INTRAVENOUS

## 2015-03-01 MED ORDER — 0.9 % SODIUM CHLORIDE (POUR BTL) OPTIME
TOPICAL | Status: DC | PRN
Start: 2015-03-01 — End: 2015-03-01
  Administered 2015-03-01: 200 mL

## 2015-03-01 MED ORDER — SUCCINYLCHOLINE CHLORIDE 20 MG/ML IJ SOLN
INTRAMUSCULAR | Status: DC | PRN
  Administered 2015-03-01: 100 mg via INTRAVENOUS

## 2015-03-01 MED ORDER — OXYCODONE HCL 5 MG PO TABS: 5.0000 mg | ORAL_TABLET | Freq: Once | ORAL | Status: DC | PRN

## 2015-03-01 MED ORDER — PROPOFOL 10 MG/ML IV BOLUS
INTRAVENOUS | Status: DC | PRN
  Administered 2015-03-01: 150 mg via INTRAVENOUS

## 2015-03-01 MED ORDER — GLYCOPYRROLATE 0.2 MG/ML IJ SOLN
0.2000 mg | Freq: Once | INTRAMUSCULAR | Status: DC | PRN
Start: 2015-03-01 — End: 2015-03-01

## 2015-03-01 MED ORDER — PROPOFOL 500 MG/50ML IV EMUL
INTRAVENOUS | Status: AC
  Filled 2015-03-01: qty 100

## 2015-03-01 MED ORDER — OXYCODONE HCL 5 MG PO TABS: 5.0000 mg | ORAL_TABLET | ORAL | Status: AC | PRN

## 2015-03-01 SURGICAL SUPPLY — 54 items
BINDER BREAST LRG (GAUZE/BANDAGES/DRESSINGS) IMPLANT
BINDER BREAST MEDIUM (GAUZE/BANDAGES/DRESSINGS) IMPLANT
BINDER BREAST XLRG (GAUZE/BANDAGES/DRESSINGS) ×3 IMPLANT
BINDER BREAST XXLRG (GAUZE/BANDAGES/DRESSINGS) IMPLANT
BLADE SURG 10 STRL SS (BLADE) ×3 IMPLANT
BLADE SURG 15 STRL LF DISP TIS (BLADE) ×1 IMPLANT
BLADE SURG 15 STRL SS (BLADE) ×2
BNDG GAUZE ELAST 4 BULKY (GAUZE/BANDAGES/DRESSINGS) ×6 IMPLANT
CANISTER SUCT 1200ML W/VALVE (MISCELLANEOUS) ×3 IMPLANT
CHLORAPREP W/TINT 26ML (MISCELLANEOUS) ×3 IMPLANT
CLOSURE WOUND 1/2 X4 (GAUZE/BANDAGES/DRESSINGS)
COVER BACK TABLE 60X90IN (DRAPES) ×3 IMPLANT
COVER MAYO STAND STRL (DRAPES) ×3 IMPLANT
DECANTER SPIKE VIAL GLASS SM (MISCELLANEOUS) IMPLANT
DRAIN CHANNEL 15F RND FF W/TCR (WOUND CARE) IMPLANT
DRAPE LAPAROSCOPIC ABDOMINAL (DRAPES) ×6 IMPLANT
DRSG PAD ABDOMINAL 8X10 ST (GAUZE/BANDAGES/DRESSINGS) ×3 IMPLANT
DRSG TEGADERM 2-3/8X2-3/4 SM (GAUZE/BANDAGES/DRESSINGS) IMPLANT
ELECT BLADE 4.0 EZ CLEAN MEGAD (MISCELLANEOUS)
ELECT COATED BLADE 2.86 ST (ELECTRODE) ×3 IMPLANT
ELECT REM PT RETURN 9FT ADLT (ELECTROSURGICAL) ×3
ELECTRODE BLDE 4.0 EZ CLN MEGD (MISCELLANEOUS) IMPLANT
ELECTRODE REM PT RTRN 9FT ADLT (ELECTROSURGICAL) ×1 IMPLANT
EVACUATOR SILICONE 100CC (DRAIN) IMPLANT
GLOVE BIO SURGEON STRL SZ 6 (GLOVE) ×3 IMPLANT
GLOVE BIO SURGEON STRL SZ 6.5 (GLOVE) IMPLANT
GLOVE BIO SURGEONS STRL SZ 6.5 (GLOVE)
GLOVE BIOGEL PI IND STRL 7.0 (GLOVE) ×1 IMPLANT
GLOVE BIOGEL PI INDICATOR 7.0 (GLOVE) ×2
GLOVE ECLIPSE 7.0 STRL STRAW (GLOVE) ×3 IMPLANT
GOWN STRL REUS W/ TWL LRG LVL3 (GOWN DISPOSABLE) ×2 IMPLANT
GOWN STRL REUS W/TWL LRG LVL3 (GOWN DISPOSABLE) ×4
LIQUID BAND (GAUZE/BANDAGES/DRESSINGS) ×3 IMPLANT
NEEDLE HYPO 25X1 1.5 SAFETY (NEEDLE) ×3 IMPLANT
NS IRRIG 1000ML POUR BTL (IV SOLUTION) IMPLANT
PACK BASIN DAY SURGERY FS (CUSTOM PROCEDURE TRAY) ×3 IMPLANT
PENCIL BUTTON HOLSTER BLD 10FT (ELECTRODE) ×3 IMPLANT
PIN SAFETY STERILE (MISCELLANEOUS) IMPLANT
SLEEVE SCD COMPRESS KNEE MED (MISCELLANEOUS) ×3 IMPLANT
SPONGE LAP 18X18 X RAY DECT (DISPOSABLE) ×6 IMPLANT
STAPLER VISISTAT 35W (STAPLE) ×3 IMPLANT
STRIP CLOSURE SKIN 1/2X4 (GAUZE/BANDAGES/DRESSINGS) IMPLANT
SUT ETHILON 2 0 FS 18 (SUTURE) IMPLANT
SUT MNCRL AB 4-0 PS2 18 (SUTURE) ×6 IMPLANT
SUT VIC AB 3-0 PS1 18 (SUTURE) ×4
SUT VIC AB 3-0 PS1 18XBRD (SUTURE) ×2 IMPLANT
SUT VICRYL 4-0 PS2 18IN ABS (SUTURE) ×3 IMPLANT
SYR BULB IRRIGATION 50ML (SYRINGE) ×3 IMPLANT
SYR CONTROL 10ML LL (SYRINGE) ×3 IMPLANT
TOWEL OR 17X24 6PK STRL BLUE (TOWEL DISPOSABLE) ×6 IMPLANT
TUBE CONNECTING 20'X1/4 (TUBING) ×1
TUBE CONNECTING 20X1/4 (TUBING) ×2 IMPLANT
UNDERPAD 30X30 (UNDERPADS AND DIAPERS) ×6 IMPLANT
YANKAUER SUCT BULB TIP NO VENT (SUCTIONS) ×3 IMPLANT

## 2015-03-01 NOTE — Interval H&P Note (Signed)
History and Physical Interval Note:  03/01/2015 8:17 AM  Shannon [name]  has presented today for surgery, with the diagnosis of acquired absent breast/history of breast reconstruction  The various methods of treatment have been discussed with the patient and family. After consideration of risks, benefits and other options for treatment, the patient has consented to  Procedure(s): REVISION RIGHT TRANSVERSE RECTUS ABDOMINIS MYOCUTANEOUS (TRAM) FLAP (Right) as a surgical intervention .  The patient's history has been reviewed, patient examined, no change in status, stable for surgery.  I have reviewed the patient's chart and labs.  Questions were answered to the patient's satisfaction.     Dajanae Brophy

## 2015-03-01 NOTE — Transfer of Care (Signed)
Immediate Anesthesia Transfer of Care Note  Patient: Shannon [name]  Procedure(s) Performed: Procedure(s): REVISION RIGHT TRANSVERSE RECTUS ABDOMINIS MYOCUTANEOUS (TRAM) FLAP (Right)  Patient Location: PACU  Anesthesia Type:General  Level of Consciousness: awake, alert  and oriented  Airway & Oxygen Therapy: Patient Spontanous Breathing and Patient connected to nasal cannula oxygen  Post-op Assessment: Report given to RN and Post -op Vital signs reviewed and stable  Post vital signs: Reviewed and stable  Last Vitals:  Filed Vitals:   03/01/15 1140  BP:   Pulse: 124  Temp:   Resp:     Complications: No apparent anesthesia complications

## 2015-03-01 NOTE — Anesthesia Postprocedure Evaluation (Signed)
  Anesthesia Post-op Note  Patient: Shannon [name]  Procedure(s) Performed: Procedure(s): REVISION RIGHT TRANSVERSE RECTUS ABDOMINIS MYOCUTANEOUS (TRAM) FLAP (Right)  Patient Location: PACU  Anesthesia Type: General   Level of Consciousness: awake, alert  and oriented  Airway and Oxygen Therapy: Patient Spontanous Breathing  Post-op Pain: none  Post-op Assessment: Post-op Vital signs reviewed  Post-op Vital Signs: Reviewed  Last Vitals:  Filed Vitals:   03/01/15 1314  BP: 136/73  Pulse:   Temp: 36.6 C  Resp: 20    Complications: No apparent anesthesia complications

## 2015-03-01 NOTE — Anesthesia Preprocedure Evaluation (Signed)
Anesthesia Evaluation  Patient identified by MRN, date of birth, ID band Patient awake    Reviewed: Allergy & Precautions, Patient's Chart, lab work & pertinent test results  History of Anesthesia Complications (+) PONV  Airway Mallampati: I  TM Distance: >3 FB Neck ROM: Full    Dental  (+) Teeth Intact, Dental Advisory Given   Pulmonary COPDformer smoker,  breath sounds clear to auscultation        Cardiovascular hypertension, Pt. on medications Rhythm:Regular Rate:Normal     Neuro/Psych    GI/Hepatic GERD-  Medicated and Controlled,  Endo/Other    Renal/GU      Musculoskeletal   Abdominal   Peds  Hematology   Anesthesia Other Findings   Reproductive/Obstetrics                             Anesthesia Physical Anesthesia Plan  ASA: III  Anesthesia Plan: General   Post-op Pain Management:    Induction: Intravenous  Airway Management Planned: Oral ETT  Additional Equipment:   Intra-op Plan:   Post-operative Plan: Extubation in OR  Informed Consent: I have reviewed the patients History and Physical, chart, labs and discussed the procedure including the risks, benefits and alternatives for the proposed anesthesia with the patient or authorized representative who has indicated his/her understanding and acceptance.   Dental advisory given  Plan Discussed with: CRNA, Anesthesiologist and Surgeon  Anesthesia Plan Comments:         Anesthesia Quick Evaluation

## 2015-03-01 NOTE — Op Note (Signed)
Operative Note   DATE OF OPERATION:5.13.2016  LOCATION: [place] outpatient  SURGICAL DIVISION: Plastic Surgery  PREOPERATIVE DIAGNOSES:  1. History right breast cancer 2. S/p right TRAM breast reconstruction 3. Asymmetry native and reconstructed breasts  POSTOPERATIVE DIAGNOSES:  same  PROCEDURE:  1. Revision to right reconstructed breast  SURGEON: Irene Limbo MD MBA  ASSISTANT: none  ANESTHESIA:  General.   EBL: 20 ml  COMPLICATIONS: None immediate.   INDICATIONS FOR PROCEDURE:  The patient, Shannon [name], is a 69 y.o. female born on October 18, 1946, is here for revision right breast reconstruction. She has previously undergone TRAM flap reconstruction and left breast mastopexy augmentation. She complains of asymmetry with larger right breast, especially laterally.   FINDINGS: Total lipoaspirate 500 g from right lateral chest wall, direct resection flap total 38 g.  DESCRIPTION OF PROCEDURE:  The patient was marked with the patient in the preoperative area in standing position to mark desired amount of lift of reconstructed breast mound by pinch technique. Areas for contouring lateral chest wall marked for liposuction. The patient was taken to the operating room. SCDs were placed and IV antibiotics were given. The patient's operative site was prepped and draped in a sterile fashion. A time out was performed and all information was confirmed to be correct. Incision made in prior scars over right chest and tumescent infiltrated over lateral chest wall. Total 450 ml tumescent infiltrated. Power assisted liposuction performed in this area until even soft tissue thickness obtained. Total aspirate 500 ml. Patient brought to upright sitting position and reconstructed mound tailor tacked for narrowing base and elevation mound to aid with symmetry left breast. Returned to supine position and areas marked by tailor tacking of flap sharply excised; excision included skin and small  amount subcutaneous tissue. Wound irrigated and hemostasis obtained. Closure completed with 3-0 vicryl to approximate superficial fascia. 4-0 vicryl placed in dermis. Skin closure completed with 4-0 monocryl subcuticular and application tissue adhesive. Dry dressing and breast binder applied.   The patient was allowed to wake from anesthesia, extubated and taken to the recovery room in satisfactory condition.   SPECIMENS: none  DRAINS: none  Irene Limbo, MD [facility] Plastic & Reconstructive Surgery [phone]

## 2015-03-01 NOTE — Discharge Instructions (Signed)

## 2015-03-01 NOTE — Anesthesia Procedure Notes (Signed)
Procedure Name: Intubation Date/Time: 03/01/2015 10:04 AM Performed by: Melynda Ripple D Pre-anesthesia Checklist: Patient identified, Emergency Drugs available, Suction available and Patient being monitored Patient Re-evaluated:Patient Re-evaluated prior to inductionOxygen Delivery Method: Circle System Utilized Preoxygenation: Pre-oxygenation with 100% oxygen Intubation Type: IV induction Ventilation: Mask ventilation without difficulty Laryngoscope Size: Mac and 3 Grade View: Grade II Tube type: Oral Number of attempts: 1 Airway Equipment and Method: Stylet and Oral airway Placement Confirmation: ETT inserted through vocal cords under direct vision,  positive ETCO2 and breath sounds checked- equal and bilateral Secured at: 23 cm Tube secured with: Tape Dental Injury: Teeth and Oropharynx as per pre-operative assessment

## 2015-03-04 ENCOUNTER — Encounter (HOSPITAL_BASED_OUTPATIENT_CLINIC_OR_DEPARTMENT_OTHER): Payer: Self-pay | Admitting: Plastic Surgery

## 2015-03-04 NOTE — Addendum Note (Signed)
Addendum  created 03/04/15 1115 by Tawni Millers, CRNA   Modules edited: Charges VN

## 2015-03-13 ENCOUNTER — Telehealth: Payer: Self-pay | Admitting: Pulmonary Disease

## 2015-03-13 ENCOUNTER — Other Ambulatory Visit: Payer: Self-pay | Admitting: Family Medicine

## 2015-03-13 MED ORDER — TIOTROPIUM BROMIDE-OLODATEROL 2.5-2.5 MCG/ACT IN AERS: 2.0000 | INHALATION_SPRAY | Freq: Every day | RESPIRATORY_TRACT | Status: DC

## 2015-03-13 NOTE — Telephone Encounter (Signed)
As long as she feels OK then I don't think it is a problem. However if she sees an increase in the amount of sputum or if she has shortness of breath or a fever then we need to know. It is not something that I have heard other patients mention, so I'm not sure if it is directly related to the [facility] or not

## 2015-03-13 NOTE — Telephone Encounter (Signed)
Pharmacy has called stating that [place] is not on pt's formulary. Would you like to change medication or have a PA initiated?  Please advise.

## 2015-03-13 NOTE — Telephone Encounter (Signed)
Spoke with the pt  She states that stiolto has helped her breathing some She is coughing more and wants to know if this is supposed to occur while taking this med  She is coughing up dark brown sputum, but "I don't feel bad at all" She wants to see if BQ thinks she should continue med  Please advise, thanks!

## 2015-03-13 NOTE — Telephone Encounter (Signed)
Pt informed of response per BQ and will cont to use Stiolto. RX sent to pharmacy. Pt will call back with any concerns. Nothing further needed at this time.

## 2015-03-14 NOTE — Telephone Encounter (Signed)
BQ please advise if you would like to start PA process for patient. Thanks.

## 2015-03-15 NOTE — Telephone Encounter (Signed)
Fine by me 

## 2015-03-15 NOTE — Telephone Encounter (Signed)
Called and spoke to insurance at [phone]. Stiolto is a formulary exception, completed form faxed back to insurance. Form placed in blue accordion folder in triage. Will await a decision.

## 2015-03-15 NOTE — Telephone Encounter (Signed)
Yaya cb from [employer], [phone], this medication has been approved

## 2015-03-15 NOTE — Telephone Encounter (Signed)
Called and spoke to [place] is approved from 03/15/15 to 03/14/2016. Key approval code: [id number] Called and spoke to pt's husband. He verbalized understanding. Pharmacy aware. Nothing further needed at this time.

## 2015-06-18 ENCOUNTER — Ambulatory Visit (HOSPITAL_BASED_OUTPATIENT_CLINIC_OR_DEPARTMENT_OTHER): Admission: RE | Admit: 2015-06-18 | Payer: Medicare Other | Source: Ambulatory Visit | Admitting: Plastic Surgery

## 2015-06-18 ENCOUNTER — Encounter (HOSPITAL_BASED_OUTPATIENT_CLINIC_OR_DEPARTMENT_OTHER): Admission: RE | Payer: Self-pay | Source: Ambulatory Visit

## 2015-06-18 SURGERY — RECONSTRUCTION, BREAST
Anesthesia: General | Laterality: Right

## 2015-07-04 ENCOUNTER — Other Ambulatory Visit: Payer: Self-pay

## 2015-07-04 MED ORDER — TIOTROPIUM BROMIDE-OLODATEROL 2.5-2.5 MCG/ACT IN AERS: 2.0000 | INHALATION_SPRAY | Freq: Every day | RESPIRATORY_TRACT | Status: AC

## 2015-07-11 ENCOUNTER — Other Ambulatory Visit: Payer: Self-pay

## 2015-07-11 MED ORDER — SERTRALINE HCL 50 MG PO TABS: 50.0000 mg | ORAL_TABLET | Freq: Every morning | ORAL | Status: DC

## 2015-07-11 NOTE — Telephone Encounter (Signed)
Pt moved to [facility] this week and needs one refill until establish with local physician. Per protocol refilled x 1 to [facility] [place].

## 2015-08-05 ENCOUNTER — Other Ambulatory Visit: Payer: Self-pay | Admitting: *Deleted

## 2015-08-05 MED ORDER — SERTRALINE HCL 50 MG PO TABS: 50.0000 mg | ORAL_TABLET | Freq: Every morning | ORAL | Status: AC

## 2015-08-08 ENCOUNTER — Ambulatory Visit: Payer: Medicare Other | Admitting: Pulmonary Disease

## 2015-08-17 ENCOUNTER — Other Ambulatory Visit: Payer: Self-pay | Admitting: Family Medicine

## 2015-09-09 ENCOUNTER — Other Ambulatory Visit: Payer: Self-pay | Admitting: Family Medicine

## 2015-09-09 NOTE — Telephone Encounter (Signed)
Ok to refill one time only.  Needs OV for further refills. 

## 2015-09-09 NOTE — Telephone Encounter (Signed)
Pt has not had any recent f/u appts 

## 2015-10-08 ENCOUNTER — Other Ambulatory Visit: Payer: Self-pay | Admitting: Pulmonary Disease

## 2015-10-08 ENCOUNTER — Telehealth: Payer: Self-pay | Admitting: Pulmonary Disease

## 2015-10-08 DIAGNOSIS — R911 Solitary pulmonary nodule: Secondary | ICD-10-CM

## 2015-10-08 NOTE — Telephone Encounter (Signed)
FYI - Order was put in for patient to have CT in January.  I scheduled the appt & when I called the patient she states she has moved to [place]. She has an appt down there with a pulmonologist in January.

## 2015-10-11 ENCOUNTER — Other Ambulatory Visit: Payer: Self-pay | Admitting: Family Medicine

## 2015-10-17 ENCOUNTER — Encounter: Payer: Self-pay | Admitting: *Deleted

## 2015-10-31 ENCOUNTER — Other Ambulatory Visit: Payer: Medicare Other
